# Patient Record
Sex: Female | Born: 1995
Health system: Southern US, Community
[De-identification: ages and names within clinical notes are randomized; demographics above are authoritative.]

## PROBLEM LIST (undated history)

## (undated) DIAGNOSIS — K2990 Gastroduodenitis, unspecified, without bleeding: Secondary | ICD-10-CM

## (undated) DIAGNOSIS — F32A Depression, unspecified: Secondary | ICD-10-CM

## (undated) DIAGNOSIS — E042 Nontoxic multinodular goiter: Secondary | ICD-10-CM

## (undated) DIAGNOSIS — F419 Anxiety disorder, unspecified: Secondary | ICD-10-CM

## (undated) DIAGNOSIS — G43909 Migraine, unspecified, not intractable, without status migrainosus: Secondary | ICD-10-CM

## (undated) DIAGNOSIS — J45909 Unspecified asthma, uncomplicated: Secondary | ICD-10-CM

## (undated) DIAGNOSIS — E079 Disorder of thyroid, unspecified: Secondary | ICD-10-CM

## (undated) DIAGNOSIS — F329 Major depressive disorder, single episode, unspecified: Secondary | ICD-10-CM

## (undated) HISTORY — DX: Nontoxic multinodular goiter: E04.2

## (undated) HISTORY — PX: TONSILLECTOMY: SUR1361

## (undated) HISTORY — DX: Migraine, unspecified, not intractable, without status migrainosus: G43.909

## (undated) HISTORY — DX: Anxiety disorder, unspecified: F41.9

## (undated) HISTORY — DX: Gastroduodenitis, unspecified, without bleeding: K29.90

## (undated) HISTORY — DX: Unspecified asthma, uncomplicated: J45.909

## (undated) HISTORY — PX: NO PAST SURGERIES: SHX2092

---
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
## 2016-02-03 DIAGNOSIS — E039 Hypothyroidism, unspecified: Secondary | ICD-10-CM | POA: Diagnosis not present

## 2016-02-03 DIAGNOSIS — R6889 Other general symptoms and signs: Secondary | ICD-10-CM | POA: Diagnosis not present

## 2016-02-03 DIAGNOSIS — E042 Nontoxic multinodular goiter: Secondary | ICD-10-CM | POA: Diagnosis not present

## 2016-11-01 DIAGNOSIS — E069 Thyroiditis, unspecified: Secondary | ICD-10-CM | POA: Diagnosis not present

## 2016-11-01 DIAGNOSIS — E221 Hyperprolactinemia: Secondary | ICD-10-CM | POA: Diagnosis not present

## 2016-11-30 DIAGNOSIS — F321 Major depressive disorder, single episode, moderate: Secondary | ICD-10-CM | POA: Diagnosis not present

## 2016-11-30 DIAGNOSIS — R350 Frequency of micturition: Secondary | ICD-10-CM | POA: Diagnosis not present

## 2016-12-28 DIAGNOSIS — F321 Major depressive disorder, single episode, moderate: Secondary | ICD-10-CM | POA: Diagnosis not present

## 2017-01-03 MED FILL — ESCITALOPRAM 10 MG TABLET: 10 | 30 days supply | Qty: 30 | Fill #0

## 2017-01-18 DIAGNOSIS — N390 Urinary tract infection, site not specified: Secondary | ICD-10-CM | POA: Diagnosis not present

## 2017-01-18 DIAGNOSIS — R319 Hematuria, unspecified: Secondary | ICD-10-CM | POA: Diagnosis not present

## 2017-01-30 MED FILL — ESCITALOPRAM 10 MG TABLET: 10 | 30 days supply | Qty: 30 | Fill #0

## 2017-02-12 DIAGNOSIS — N3001 Acute cystitis with hematuria: Secondary | ICD-10-CM | POA: Diagnosis not present

## 2017-02-12 DIAGNOSIS — R319 Hematuria, unspecified: Secondary | ICD-10-CM | POA: Diagnosis not present

## 2017-02-12 DIAGNOSIS — R300 Dysuria: Secondary | ICD-10-CM | POA: Diagnosis not present

## 2017-03-07 MED FILL — ESCITALOPRAM 10 MG TABLET: 10 | 30 days supply | Qty: 30 | Fill #1

## 2017-03-21 DIAGNOSIS — B349 Viral infection, unspecified: Secondary | ICD-10-CM | POA: Diagnosis not present

## 2017-03-25 DIAGNOSIS — J029 Acute pharyngitis, unspecified: Secondary | ICD-10-CM | POA: Diagnosis not present

## 2017-03-25 DIAGNOSIS — J069 Acute upper respiratory infection, unspecified: Secondary | ICD-10-CM | POA: Diagnosis not present

## 2017-03-25 DIAGNOSIS — N3001 Acute cystitis with hematuria: Secondary | ICD-10-CM | POA: Diagnosis not present

## 2017-04-03 DIAGNOSIS — N3091 Cystitis, unspecified with hematuria: Secondary | ICD-10-CM | POA: Diagnosis not present

## 2017-04-03 DIAGNOSIS — N3001 Acute cystitis with hematuria: Secondary | ICD-10-CM | POA: Diagnosis not present

## 2017-04-11 MED FILL — ESCITALOPRAM 10 MG TABLET: 10 | 30 days supply | Qty: 30 | Fill #2

## 2017-05-13 MED FILL — ESCITALOPRAM 10 MG TABLET: 10 | 30 days supply | Qty: 30 | Fill #0

## 2017-06-21 MED FILL — ESCITALOPRAM 10 MG TABLET: 10 | 30 days supply | Qty: 30 | Fill #1

## 2017-07-26 MED FILL — ESCITALOPRAM 10 MG TABLET: 10 | 30 days supply | Qty: 30 | Fill #2

## 2017-08-29 MED FILL — ESCITALOPRAM 10 MG TABLET: 10 | 30 days supply | Qty: 30 | Fill #0

## 2017-09-11 DIAGNOSIS — J302 Other seasonal allergic rhinitis: Secondary | ICD-10-CM | POA: Diagnosis not present

## 2017-09-27 DIAGNOSIS — Z0000 Encounter for general adult medical examination without abnormal findings: Secondary | ICD-10-CM | POA: Diagnosis not present

## 2017-09-27 DIAGNOSIS — J302 Other seasonal allergic rhinitis: Secondary | ICD-10-CM | POA: Diagnosis not present

## 2017-09-27 DIAGNOSIS — E042 Nontoxic multinodular goiter: Secondary | ICD-10-CM | POA: Diagnosis not present

## 2017-09-27 DIAGNOSIS — E221 Hyperprolactinemia: Secondary | ICD-10-CM | POA: Diagnosis not present

## 2017-09-27 DIAGNOSIS — F321 Major depressive disorder, single episode, moderate: Secondary | ICD-10-CM | POA: Diagnosis not present

## 2017-09-27 MED FILL — ESCITALOPRAM 10 MG TABLET: 10 | 90 days supply | Qty: 90 | Fill #0

## 2017-11-01 DIAGNOSIS — E221 Hyperprolactinemia: Secondary | ICD-10-CM | POA: Diagnosis not present

## 2017-11-01 DIAGNOSIS — E069 Thyroiditis, unspecified: Secondary | ICD-10-CM | POA: Diagnosis not present

## 2017-11-01 DIAGNOSIS — E042 Nontoxic multinodular goiter: Secondary | ICD-10-CM | POA: Diagnosis not present

## 2017-11-05 DIAGNOSIS — E069 Thyroiditis, unspecified: Secondary | ICD-10-CM | POA: Diagnosis not present

## 2017-11-05 DIAGNOSIS — E221 Hyperprolactinemia: Secondary | ICD-10-CM | POA: Diagnosis not present

## 2017-12-04 DIAGNOSIS — Z01419 Encounter for gynecological examination (general) (routine) without abnormal findings: Secondary | ICD-10-CM | POA: Diagnosis not present

## 2017-12-30 DIAGNOSIS — R399 Unspecified symptoms and signs involving the genitourinary system: Secondary | ICD-10-CM | POA: Diagnosis not present

## 2017-12-30 DIAGNOSIS — M545 Low back pain: Secondary | ICD-10-CM | POA: Diagnosis not present

## 2017-12-30 DIAGNOSIS — R110 Nausea: Secondary | ICD-10-CM | POA: Diagnosis not present

## 2017-12-30 DIAGNOSIS — R600 Localized edema: Secondary | ICD-10-CM | POA: Diagnosis not present

## 2017-12-30 MED FILL — ESCITALOPRAM 10 MG TABLET: 10 | 90 days supply | Qty: 90 | Fill #1

## 2018-01-01 DIAGNOSIS — R110 Nausea: Secondary | ICD-10-CM | POA: Diagnosis not present

## 2018-01-01 DIAGNOSIS — R399 Unspecified symptoms and signs involving the genitourinary system: Secondary | ICD-10-CM | POA: Diagnosis not present

## 2018-03-18 DIAGNOSIS — R0789 Other chest pain: Secondary | ICD-10-CM | POA: Diagnosis not present

## 2018-03-18 DIAGNOSIS — R079 Chest pain, unspecified: Secondary | ICD-10-CM | POA: Diagnosis not present

## 2018-03-18 DIAGNOSIS — E876 Hypokalemia: Secondary | ICD-10-CM | POA: Diagnosis not present

## 2018-03-18 DIAGNOSIS — E860 Dehydration: Secondary | ICD-10-CM | POA: Diagnosis not present

## 2018-03-18 DIAGNOSIS — I4510 Unspecified right bundle-branch block: Secondary | ICD-10-CM | POA: Diagnosis not present

## 2018-03-18 DIAGNOSIS — Z880 Allergy status to penicillin: Secondary | ICD-10-CM | POA: Diagnosis not present

## 2018-03-25 DIAGNOSIS — R1011 Right upper quadrant pain: Secondary | ICD-10-CM | POA: Diagnosis not present

## 2018-03-25 DIAGNOSIS — R112 Nausea with vomiting, unspecified: Secondary | ICD-10-CM | POA: Diagnosis not present

## 2018-03-25 DIAGNOSIS — E876 Hypokalemia: Secondary | ICD-10-CM | POA: Diagnosis not present

## 2018-03-25 DIAGNOSIS — R1031 Right lower quadrant pain: Secondary | ICD-10-CM | POA: Diagnosis not present

## 2018-03-25 DIAGNOSIS — R102 Pelvic and perineal pain: Secondary | ICD-10-CM | POA: Diagnosis not present

## 2018-03-25 DIAGNOSIS — D72829 Elevated white blood cell count, unspecified: Secondary | ICD-10-CM | POA: Diagnosis not present

## 2018-03-31 DIAGNOSIS — R1013 Epigastric pain: Secondary | ICD-10-CM | POA: Diagnosis not present

## 2018-03-31 DIAGNOSIS — R112 Nausea with vomiting, unspecified: Secondary | ICD-10-CM | POA: Diagnosis not present

## 2018-03-31 DIAGNOSIS — R1011 Right upper quadrant pain: Secondary | ICD-10-CM | POA: Diagnosis not present

## 2018-04-01 MED FILL — ESCITALOPRAM 10 MG TABLET: 10 | 90 days supply | Qty: 90 | Fill #2

## 2018-04-16 DIAGNOSIS — R1013 Epigastric pain: Secondary | ICD-10-CM | POA: Diagnosis not present

## 2018-04-16 DIAGNOSIS — R197 Diarrhea, unspecified: Secondary | ICD-10-CM | POA: Diagnosis not present

## 2018-04-16 DIAGNOSIS — R112 Nausea with vomiting, unspecified: Secondary | ICD-10-CM | POA: Diagnosis not present

## 2018-04-16 DIAGNOSIS — R1011 Right upper quadrant pain: Secondary | ICD-10-CM | POA: Diagnosis not present

## 2018-04-17 DIAGNOSIS — K2990 Gastroduodenitis, unspecified, without bleeding: Secondary | ICD-10-CM | POA: Diagnosis not present

## 2018-04-17 DIAGNOSIS — R112 Nausea with vomiting, unspecified: Secondary | ICD-10-CM | POA: Diagnosis not present

## 2018-04-17 DIAGNOSIS — K3189 Other diseases of stomach and duodenum: Secondary | ICD-10-CM | POA: Diagnosis not present

## 2018-04-17 DIAGNOSIS — R1013 Epigastric pain: Secondary | ICD-10-CM | POA: Diagnosis not present

## 2018-05-28 DIAGNOSIS — K219 Gastro-esophageal reflux disease without esophagitis: Secondary | ICD-10-CM | POA: Diagnosis not present

## 2018-05-28 DIAGNOSIS — R112 Nausea with vomiting, unspecified: Secondary | ICD-10-CM | POA: Diagnosis not present

## 2018-05-28 MED FILL — OMEPRAZOLE 40 MG CPDR: 40 | 90 days supply | Qty: 90 | Fill #0

## 2018-07-03 MED FILL — ESCITALOPRAM 10 MG TABLET: 10 | 90 days supply | Qty: 90 | Fill #0

## 2018-10-16 MED FILL — ESCITALOPRAM 10 MG TABLET: 10 | 90 days supply | Qty: 90 | Fill #0

## 2018-11-03 DIAGNOSIS — E069 Thyroiditis, unspecified: Secondary | ICD-10-CM | POA: Diagnosis not present

## 2018-11-03 DIAGNOSIS — E221 Hyperprolactinemia: Secondary | ICD-10-CM | POA: Diagnosis not present

## 2018-11-05 MED FILL — OMEPRAZOLE DR 40 MG CAPSULE: 40 | 90 days supply | Qty: 90 | Fill #1

## 2018-11-25 DIAGNOSIS — E2740 Unspecified adrenocortical insufficiency: Secondary | ICD-10-CM | POA: Diagnosis not present

## 2018-12-02 ENCOUNTER — Encounter (HOSPITAL_COMMUNITY): Payer: Self-pay | Admitting: Emergency Medicine

## 2018-12-02 ENCOUNTER — Other Ambulatory Visit: Payer: Self-pay

## 2018-12-02 ENCOUNTER — Emergency Department (HOSPITAL_COMMUNITY): Payer: 59

## 2018-12-02 ENCOUNTER — Emergency Department (HOSPITAL_COMMUNITY)
Admission: EM | Admit: 2018-12-02 | Discharge: 2018-12-02 | Disposition: A | Discharge status: Home / Self Care | Payer: 59 | Attending: Emergency Medicine | Admitting: Emergency Medicine

## 2018-12-02 DIAGNOSIS — R51 Headache: Secondary | ICD-10-CM | POA: Diagnosis not present

## 2018-12-02 DIAGNOSIS — R519 Headache, unspecified: Secondary | ICD-10-CM

## 2018-12-02 HISTORY — DX: Depression, unspecified: F32.A

## 2018-12-02 HISTORY — DX: Disorder of thyroid, unspecified: E07.9

## 2018-12-02 LAB — BASIC METABOLIC PANEL
Anion gap: 9 (ref 5–15)
BUN: 13 mg/dL (ref 6–20)
CO2: 26 mmol/L (ref 22–32)
Calcium: 9.2 mg/dL (ref 8.9–10.3)
Chloride: 103 mmol/L (ref 98–111)
Creatinine, Ser: 0.75 mg/dL (ref 0.44–1.00)
GFR calc Af Amer: >60 mL/min (ref >60)
GFR calc non Af Amer: >60 mL/min (ref >60)
Glucose, Bld: 96 mg/dL (ref 70–99)
Potassium: 3.5 mmol/L (ref 3.5–5.1)
Sodium: 138 mmol/L (ref 135–145)

## 2018-12-02 LAB — CBC WITH DIFFERENTIAL/PLATELET
Abs Immature Granulocytes: 0.06 10*3/uL (ref 0.00–0.07)
Basophils Absolute: 0.1 10*3/uL (ref 0.0–0.1)
Basophils Relative: 0 %
Eosinophils Absolute: 0.2 10*3/uL (ref 0.0–0.5)
Eosinophils Relative: 2 %
HCT: 37.9 % (ref 36.0–46.0)
Hemoglobin: 12.6 g/dL (ref 12.0–15.0)
Immature Granulocytes: 1 %
Lymphocytes Relative: 25 %
Lymphs Abs: 2.9 10*3/uL (ref 0.7–4.0)
MCH: 29.9 pg (ref 26.0–34.0)
MCHC: 33.2 g/dL (ref 30.0–36.0)
MCV: 89.8 fL (ref 80.0–100.0)
Monocytes Absolute: 0.8 10*3/uL (ref 0.1–1.0)
Monocytes Relative: 7 %
Neutro Abs: 7.4 10*3/uL (ref 1.7–7.7)
Neutrophils Relative %: 65 %
Platelets: 409 10*3/uL — ABNORMAL HIGH (ref 150–400)
RBC: 4.22 MIL/uL (ref 3.87–5.11)
RDW: 12.1 % (ref 11.5–15.5)
WBC: 11.5 10*3/uL — ABNORMAL HIGH (ref 4.0–10.5)
nRBC: 0 % (ref 0.0–0.2)

## 2018-12-02 LAB — I-STAT BETA HCG BLOOD, ED (MC, WL, AP ONLY): I-stat hCG, quantitative: <5 m[IU]/mL (ref <5)

## 2018-12-02 MED ORDER — IOHEXOL 350 MG/ML SOLN
75.0000 mL | Freq: Once | INTRAVENOUS | Status: AC | PRN
  Administered 2018-12-02: 75 mL via INTRAVENOUS

## 2018-12-02 MED ORDER — KETOROLAC TROMETHAMINE 15 MG/ML IJ SOLN
15.0000 mg | Freq: Once | INTRAMUSCULAR | Status: AC
  Administered 2018-12-02: 15 mg via INTRAVENOUS
  Filled 2018-12-02: qty 1

## 2018-12-02 MED ORDER — METOCLOPRAMIDE HCL 5 MG/ML IJ SOLN
10.0000 mg | Freq: Once | INTRAMUSCULAR | Status: AC
  Administered 2018-12-02: 10 mg via INTRAVENOUS
  Filled 2018-12-02: qty 2

## 2018-12-02 MED ORDER — SODIUM CHLORIDE 0.9 % IV BOLUS
500.0000 mL | Freq: Once | INTRAVENOUS | Status: AC
  Administered 2018-12-02: 500 mL via INTRAVENOUS

## 2018-12-02 MED ORDER — DIPHENHYDRAMINE HCL 50 MG/ML IJ SOLN
12.5000 mg | Freq: Once | INTRAMUSCULAR | Status: AC
  Administered 2018-12-02: 12.5 mg via INTRAVENOUS
  Filled 2018-12-02: qty 1

## 2018-12-02 NOTE — Discharge Instructions (Addendum)
You have been diagnosed today with Headache.  At this time there does not appear to be the presence of an emergent medical condition, however there is always the potential for conditions to change. Please read and follow the below instructions.  Please return to the Emergency Department immediately for any new or worsening symptoms return. Please be sure to follow up with your Primary Care Provider within one week regarding your visit today; please call their office to schedule an appointment even if you are feeling better for a follow-up visit. You may follow-up with the specialist at [facility] on her discharge paperwork, call their office tomorrow to schedule a follow-up appointment.  Get help right away if: Your headache gets very bad quickly. Your headache gets worse after a lot of physical activity. You keep throwing up. You have a stiff neck. You have trouble seeing. You have trouble speaking. You have pain in the eye or ear. Your muscles are weak or you lose muscle control. You lose your balance or have trouble walking. You feel like you will pass out (faint) or you pass out. You are mixed up (confused). You have a seizure. Any new/concerning or worsening symptoms  Please read the additional information packets attached to your discharge summary.

## 2018-12-02 NOTE — ED Triage Notes (Signed)
Pt states at about 10 am today while at work she got a sudden 10/10 headache on the left side of her head and into the left base of her skull. Pt state she has had a headache similar to this once and after a MRI they told her she may have a tumor on her pituitary gland or it could be a misshapen gland.

## 2018-12-02 NOTE — ED Notes (Signed)
Pt verbalized understanding of d/c instructions and has no further questions, VSS, NAD.  

## 2018-12-02 NOTE — ED Provider Notes (Signed)
[facility] Provider Note   CSN: [id number] Arrival date & time: 12/02/18  1027     History   Chief Complaint Chief Complaint  Patient presents with  . Headache    HPI Natasha Giles is a 23 y.o. female presents today for headache.  Patient is an [employer] and was working on shift just after 10 AM today when she had a sudden onset sharp in nature severe/worst of life headache on the right side of her head extending from the forehead all the way to the occiput that lasted approximately 45 minutes. She denies exertion prior to onset or thunderclap worsening, she describes quick onset however not instantaneous, no syncope. Patient reports that she has had headaches in the past but never this severe and they are normally in the center of her forehead and duller.  Patient reports during a headache prior she had an MRI with some sort of abnormal pituitary finding but she cannot recall exactly what this was, unable to find previous imaging in medical record.  Patient reports that her headache has begun to improve more since arrival in ED.  Patient denies fever/chills, vision changes, confusion, difficulty speaking, neck pain/stiffness, chest pain/shortness of breath, abdominal pain, nausea/vomiting, fall/injury or any additional concerns.    HPI  Past Medical History:  Diagnosis Date  . Depression   . Thyroid disease     There are no active problems to display for this patient.      OB History   No obstetric history on file.      Home Medications    Prior to Admission medications   Not on File    Family History No family history on file.  Social History Social History   Tobacco Use  . Smoking status: Never Smoker  Substance Use Topics  . Alcohol use: Yes  . Drug use: Never     Allergies   Penicillins   Review of Systems Review of Systems Ten systems are reviewed and are negative for acute change except as noted in the HPI   Physical Exam Updated Vital Signs BP (!) 109/58   Pulse 81   Temp 98.3 F (36.8 C) (Oral)   Resp 18   LMP 11/14/2018   SpO2 98%   Physical Exam Constitutional:      General: She is not in acute distress.    Appearance: Normal appearance. She is well-developed. She is not ill-appearing or diaphoretic.  HENT:     Head: Normocephalic and atraumatic. No raccoon eyes, Battle's sign or contusion.     Jaw: There is normal jaw occlusion. No trismus.     Comments: No temporal artery area tenderness    Right Ear: External ear normal.     Left Ear: External ear normal.     Ears:     Comments: Hearing grossly intact bilaterally    Nose: Nose normal. No rhinorrhea.     Right Nostril: No epistaxis.     Left Nostril: No epistaxis.     Mouth/Throat:     Mouth: Mucous membranes are moist.     Pharynx: Oropharynx is clear. Uvula midline.  Eyes:     General: Vision grossly intact. Gaze aligned appropriately.     Extraocular Movements: Extraocular movements intact.     Conjunctiva/sclera: Conjunctivae normal.     Pupils: Pupils are equal, round, and reactive to light.     Comments: Visual fields grossly intact bilaterally No pain with EOM  Neck:  Musculoskeletal: Full passive range of motion without pain, normal range of motion and neck supple. No neck rigidity.     Trachea: Trachea and phonation normal. No tracheal tenderness or tracheal deviation.     Meningeal: Brudzinski's sign absent.     Comments: No carotid artery area tenderness Cardiovascular:     Rate and Rhythm: Normal rate and regular rhythm.     Pulses:          Dorsalis pedis pulses are 2+ on the right side and 2+ on the left side.  Pulmonary:     Effort: Pulmonary effort is normal. No accessory muscle usage or respiratory distress.     Breath sounds: Normal air entry.  Abdominal:     Palpations: Abdomen is soft.     Tenderness: There is no abdominal tenderness. There is no guarding or rebound.  Musculoskeletal:      Comments: No midline C/T/L spinal tenderness to palpation, no paraspinal muscle tenderness, no deformity, crepitus, or step-off noted.  Feet:     Right foot:     Protective Sensation: 3 sites tested. 3 sites sensed.     Left foot:     Protective Sensation: 3 sites tested. 3 sites sensed.  Skin:    General: Skin is warm and dry.  Neurological:     Mental Status: She is alert and oriented to person, place, and time.     GCS: GCS eye subscore is 4. GCS verbal subscore is 5. GCS motor subscore is 6.     Comments: Mental Status: Alert, oriented, thought content appropriate, able to give a coherent history. Speech fluent without evidence of aphasia. Able to follow 2 step commands without difficulty. Cranial Nerves: II: Peripheral visual fields grossly normal, pupils equal, round, reactive to light III,IV, VI: ptosis not present, extra-ocular motions intact bilaterally V,VII: smile symmetric, eyebrows raise symmetric, facial light touch sensation equal VIII: hearing grossly normal to voice X: uvula elevates symmetrically XI: bilateral shoulder shrug symmetric and strong XII: midline tongue extension without fassiculations Motor: Normal tone. 5/5 strength in upper and lower extremities bilaterally including strong and equal grip strength and dorsiflexion/plantar flexion Sensory: Sensation intact to light touch in all extremities.Negative Romberg.  Deep Tendon Reflexes: 2+ patella Cerebellar: normal finger-to-nose maze with bilateral upper extremities. Normal heel-to -shin balance bilaterally of the lower extremity. No pronator drift.  Gait: normal gait and balance CV: distal pulses palpable throughout  Psychiatric:        Mood and Affect: Mood normal.        Behavior: Behavior is cooperative.    ED Treatments / Results  Labs (all labs ordered are listed, but only abnormal results are displayed) Labs Reviewed  CBC WITH DIFFERENTIAL/PLATELET - Abnormal; Notable for the following  components:      Result Value   WBC 11.5 (*)    Platelets 409 (*)    All other components within normal limits  BASIC METABOLIC PANEL  I-STAT BETA HCG BLOOD, ED ([facility], [facility], [facility] ONLY)    EKG None  Radiology Ct Angio Head W Or Wo Contrast  Result Date: 12/02/2018 CLINICAL DATA:  Severe headache. Rule out subarachnoid hemorrhage or aneurysm EXAM: CT ANGIOGRAPHY HEAD TECHNIQUE: Multidetector CT imaging of the head was performed using the standard protocol during bolus administration of intravenous contrast. Multiplanar CT image reconstructions and MIPs were obtained to evaluate the vascular anatomy. CONTRAST:  75mL OMNIPAQUE IOHEXOL 350 MG/ML SOLN COMPARISON:  CT head 12/02/2018 FINDINGS: CTA HEAD Anterior circulation: Cavernous carotid widely  patent bilaterally. Negative for atherosclerotic disease or aneurysm. Anterior and middle cerebral arteries widely patent without stenosis. Posterior circulation: Both vertebral arteries widely patent to the basilar. Small PICA bilaterally. Basilar widely patent. AICA, superior cerebellar, posterior cerebral arteries patent bilaterally. Fetal origin of the posterior cerebral artery bilaterally with hypoplastic P1 segments bilaterally. Negative for aneurysm. Venous sinuses: Normal venous enhancement. Anatomic variants: None IMPRESSION: Normal CTA head. Negative for aneurysm, stenosis or large vessel occlusion. Electronically Signed   By: Marlan Palau M.D.   On: 12/02/2018 14:23   Ct Head Wo Contrast  Result Date: 12/02/2018 CLINICAL DATA:  Worst headache of life EXAM: CT HEAD WITHOUT CONTRAST TECHNIQUE: Contiguous axial images were obtained from the base of the skull through the vertex without intravenous contrast. COMPARISON:  None. FINDINGS: Brain: No evidence of acute infarction, hemorrhage, hydrocephalus, extra-axial collection or mass lesion/mass effect. Vascular: Negative for hyperdense vessel Skull: Negative Sinuses/Orbits: Negative Other: None IMPRESSION:  Normal CT head Electronically Signed   By: Marlan Palau M.D.   On: 12/02/2018 12:49    Procedures Procedures (including critical care time)  Medications Ordered in ED Medications  metoCLOPramide (REGLAN) injection 10 mg (10 mg Intravenous Given 12/02/18 1150)  diphenhydrAMINE (BENADRYL) injection 12.5 mg (12.5 mg Intravenous Given 12/02/18 1149)  sodium chloride 0.9 % bolus 500 mL (0 mLs Intravenous Stopped 12/02/18 1303)  ketorolac (TORADOL) 15 MG/ML injection 15 mg (15 mg Intravenous Given 12/02/18 1344)  iohexol (OMNIPAQUE) 350 MG/ML injection 75 mL (75 mLs Intravenous Contrast Given 12/02/18 1358)     Initial Impression / Assessment and Plan / ED Course  I have reviewed the triage vital signs and the nursing notes.  Pertinent labs & imaging results that were available during my care of the patient were reviewed by me and considered in my medical decision making (see chart for details).    CBC with WBC of 11.5, patient without infectious-like symptoms, do not suspect infection at this time  BMP within normal limits  Beta-hCG negative  CT head:    IMPRESSION:  Normal CT head  - Patient overall well-appearing and in no acute distress.  Normal neurologic exam.  No sign of injury. Headache improving.  - Case discussed with Dr. Effie Shy. - Patient reassessed resting comfortably no acute distress.  Reports her headache is now minimal, greatly improved after medications given today.  She is requesting some additional medication, will give 15 mg Toradol.  Long discussion held with patient regarding further work-up here in the ER for evaluation of SAH.  I discussed LP versus CTA with the patient and her risks/benefits regarding each, shared decision-making was made and patient has elected to pursue CT angiogram for further evaluation at this time.  She refuses LP today. - CTA Head:  IMPRESSION:  Normal CTA head. Negative for aneurysm, stenosis or large vessel  occlusion.  --- On  re-evaluation, patient sleeping comfortably, easily arousable to voice.  The patient denies any neurologic symptoms such as visual changes, focal numbness/weakness, balance problems, confusion, or speech difficulty to suggest a life-threatening intracranial process in addition to reassuring imaging today. The patient has no clotting risk factors thus venous sinus thrombosis is unlikely. No fevers, neck pain or nuchal rigidity to suggest meningitis. Patient is afebrile, non-toxic and well appearing. Reassuring neuro exam.   PCP follow up strongly encouraged, will also give patient referral to neurology. I have reviewed return precautions including development of fever, nausea/vomiting or neurologic symptoms, vision changes, confusion, lethargy, difficulty speaking/walking, or other new/worsening/concerning symptoms.  Patient states understanding of return precautions. Patient is agreeable to discharge. - At this time there does not appear to be any evidence of an acute emergency medical condition and the patient appears stable for discharge with appropriate outpatient follow up. Diagnosis was discussed with patient who verbalizes understanding of care plan and is agreeable to discharge. I have discussed return precautions with patient who verbalizes understanding of return precautions. Patient encouraged to follow-up with their PCP and neurology. All questions answered.  Patient's case discussed with Dr. Eulis Foster who agrees with plan to discharge with outpatient follow-up.   Note: Portions of this report may have been transcribed using voice recognition software. Every effort was made to ensure accuracy; however, inadvertent computerized transcription errors may still be present. Final Clinical Impressions(s) / ED Diagnoses   Final diagnoses:  Nonintractable headache, unspecified chronicity pattern, unspecified headache type    ED Discharge Orders    None       Gari Crown 12/02/18 1518     Daleen Bo, MD 12/04/18 1742

## 2019-01-14 DIAGNOSIS — Z6828 Body mass index (BMI) 28.0-28.9, adult: Secondary | ICD-10-CM | POA: Diagnosis not present

## 2019-01-14 DIAGNOSIS — Z01419 Encounter for gynecological examination (general) (routine) without abnormal findings: Secondary | ICD-10-CM | POA: Diagnosis not present

## 2019-01-16 DIAGNOSIS — F321 Major depressive disorder, single episode, moderate: Secondary | ICD-10-CM | POA: Diagnosis not present

## 2019-01-16 DIAGNOSIS — Z0000 Encounter for general adult medical examination without abnormal findings: Secondary | ICD-10-CM | POA: Diagnosis not present

## 2019-01-16 DIAGNOSIS — E221 Hyperprolactinemia: Secondary | ICD-10-CM | POA: Diagnosis not present

## 2019-01-16 DIAGNOSIS — E042 Nontoxic multinodular goiter: Secondary | ICD-10-CM | POA: Diagnosis not present

## 2019-01-16 MED FILL — ESCITALOPRAM 10 MG TABLET: 10 | 90 days supply | Qty: 90 | Fill #0

## 2019-01-26 NOTE — Progress Notes (Signed)
[facility]    Provider:  Dr Lucia Gaskins Requesting Provider: Emergency room Primary Care Provider:  Patient, No Pcp Per  CC:  Migraine  HPI:  Natasha Giles is a 23 y.o. female here as requested by ER for Migraines.  Past medical history depression, thyroid disease, headaches.  Patient was seen in the emergency room at the end of August, she is an Char[employer] fundraiser and just after 10 AM she had a sudden onset sharp in quality headache severe worst of life on the right side of her head extending all the way to the occiput which lasted 45 minutes.  Quick onset, she had headaches in the past never that severe and different quality normally in the center of her forehead and duller.  Imaging was negative.  Headache had improved when she was seen. 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 They were not sure whether her pituitary gland was enlarged. The headaches went away for a few years. Within the last 6 months she has had worsening headaches. Most recently she was at work, and all of a sudden something :exploded" in her head just on the right side, in the past she would lose her vision with headaches and this time she had blurry vision but no inciting event and not even a slight headache prior to the headache, holding her head down made it feel better, she describes a sharp throbbing pain, it lasted that evening, nothing helped she still had the headache back home, t went away after sleeping. Saturday they were out of town and she started getting a migraine, it usually starts behind the eyes, +photo/phonophobia and pounding, sleeping helped and she had nausea, movement made it worse, she felt weak. She loses her vision, it goes "black" for 2 minutes and her speech pattern is impaired. Husband provides much information as well. Headaches are severe.  She has  8 days of headaches a month, 2-3 days of migraines which can be severe. She has not found anything that works for the migraines. Alleve or tylenol. Her cousin has migraines. She was having episodes during sleep, she has twitching in her legs, the last week she was twitching, her arms will move, one night she had her arms up above her like she was conducting, another night jekling her limbs, body tightening up and breathing changing.He sometimes has to hold her to keep her from hitting herself.   Reviewed notes, labs and imaging from outside physicians, which showed:  CT head 12/02/2018 showed No acute intracranial abnormalities including mass lesion or mass effect, hydrocephalus, extra-axial fluid collection, midline shift, hemorrhage, or acute infarction, large ischemic events (personally reviewed images)  CTA: reviewed report: Normal CTA head. Negative for aneurysm, stenosis or large vessel occlusion.  BMP nml, CBC with WBCs 11.5 and plts 409 otherwise normal, neg pregnancy  Review of Systems: Patient complains of symptoms per HPI as well as the following symptoms: headache, weakness, slurred speech, snoring, dizziness, depression, anxiety, loss of vision, fatige. Pertinent negatives and positives per HPI. All others negative.   Social History   Socioeconomic History   Marital status: Married    Spouse name: Not on file   Number of children: 0   Years of education: Not on file   Highest education level: Bachelor's degree (e.g., BA, AB, BS)  Occupational History   Not  on file  Social Needs   Financial resource strain: Not on file   Food insecurity    Worry: Not on file    Inability: Not on file   Transportation needs    Medical: Not on file    Non-medical: Not on file  Tobacco Use   Smoking status: Never Smoker   Smokeless tobacco: Never Used  Substance and Sexual Activity   Alcohol use: Yes    Comment: 1-2 per month, occasional   Drug use: Never   Sexual  activity: Never    Birth control/protection: None  Lifestyle   Physical activity    Days per week: Not on file    Minutes per session: Not on file   Stress: Not on file  Relationships   Social connections    Talks on phone: Not on file    Gets together: Not on file    Attends religious service: Not on file    Active member of club or organization: Not on file    Attends meetings of clubs or organizations: Not on file    Relationship status: Not on file   Intimate partner violence    Fear of current or ex partner: Not on file    Emotionally abused: Not on file    Physically abused: Not on file    Forced sexual activity: Not on file  Other Topics Concern   Not on file  Social History Narrative   Lives at home with husband, Molli Hazard   Right handed   Caffeine: 1 cup coffee/day    Family History  Problem Relation Age of Onset   Hypertension Father    Diabetes Maternal Grandmother    Diabetes Maternal Grandfather    Diabetes Paternal Grandmother    Renal Disease Paternal Grandmother        ESRD   Diabetes Paternal Grandfather    Migraines Cousin     Past Medical History:  Diagnosis Date   Anxiety    Asthma    Depression    Gastritis and duodenitis    Migraine    Multiple thyroid nodules    Thyroid disease     Patient Active Problem List   Diagnosis Date Noted   Migraine without aura and without status migrainosus, not intractable 01/27/2019    Past Surgical History:  Procedure Laterality Date   NO PAST SURGERIES      Current Outpatient Medications  Medication Sig Dispense Refill   escitalopram (LEXAPRO) 10 MG tablet Take 1 tablet by mouth daily.     ondansetron (ZOFRAN-ODT) 4 MG disintegrating tablet Take 1 tablet (4 mg total) by mouth every 8 (eight) hours as needed for nausea. 30 tablet 3   rizatriptan (MAXALT-MLT) 10 MG disintegrating tablet Take 1 tablet (10 mg total) by mouth as needed for migraine. May repeat in 2 hours if needed  9 tablet 11   No current facility-administered medications for this visit.     Allergies as of 01/27/2019 - Review Complete 01/27/2019  Allergen Reaction Noted   Penicillins  12/02/2018    Vitals: BP 125/76 (BP Location: Right Arm, Patient Position: Sitting)    Pulse 78    Temp 97.9 F (36.6 C) Comment: taken at front door   Ht 5\' 6"  (1.676 m)    Wt 167 lb (75.8 kg)    BMI 26.95 kg/m  Last Weight:  Wt Readings from Last 1 Encounters:  01/27/19 167 lb (75.8 kg)   Last Height:   Ht Readings from Last  1 Encounters:  01/27/19 5\' 6"  (1.676 m)     Physical exam: Exam: Gen: NAD, conversant, well nourised, obese, well groomed                     CV: RRR, no MRG. No Carotid Bruits. No peripheral edema, warm, nontender Eyes: Conjunctivae clear without exudates or hemorrhage  Neuro: Detailed Neurologic Exam  Speech:    Speech is normal; fluent and spontaneous with normal comprehension.  Cognition:    The patient is oriented to person, place, and time;     recent and remote memory intact;     language fluent;     normal attention, concentration,     fund of knowledge Cranial Nerves:    The pupils are equal, round, and reactive to light. The fundi are normal and spontaneous venous pulsations are present. Visual fields are full to finger confrontation. Extraocular movements are intact. Trigeminal sensation is intact and the muscles of mastication are normal. The face is symmetric. The palate elevates in the midline. Hearing intact. Voice is normal. Shoulder shrug is normal. The tongue has normal motion without fasciculations.   Coordination:    Normal finger to nose and heel to shin. Normal rapid alternating movements.   Gait:    Heel-toe and tandem gait are normal.   Motor Observation:    No asymmetry, no atrophy, and no involuntary movements noted. Tone:    Normal muscle tone.    Posture:    Posture is normal. normal erect    Strength:    Strength is V/V in the upper  and lower limbs.      Sensation: intact to LT     Reflex Exam:  DTR's:    Deep tendon reflexes in the upper and lower extremities are normal bilaterally.   Toes:    The toes are downgoing bilaterally.   Clonus:    Clonus is absent.    Assessment/Plan:  Patient with migraines but had a severe episode of thunderclap and a history of enlarged pituitary. Given vision loss and other significant symptoms she needs further evaluation. Last prolactin 17.1, TSH normal, 10/2018 CMP with BMP 9 and creat 0.7 03/25/2018  She was having episodes during sleep, she has twitching in her legs, the last week she was twitching, her arms will move, one night she had her arms up above her like she was conducting, another night jekling her limbs, body tightening up and breathing changing.He sometimes has to hold her to keep her from hitting herself. Consider sleep evaluation.   I don't think the vision loss is an aura, usually an aura is gain of function. We will monitor.   Recommended primary care   Discussed: To prevent or relieve headaches, try the following: Cool Compress. Lie down and place a cool compress on your head.  Avoid headache triggers. If certain foods or odors seem to have triggered your migraines in the past, avoid them. A headache diary might help you identify triggers.  Include physical activity in your daily routine. Try a daily walk or other moderate aerobic exercise.  Manage stress. Find healthy ways to cope with the stressors, such as delegating tasks on your to-do list.  Practice relaxation techniques. Try deep breathing, yoga, massage and visualization.  Eat regularly. Eating regularly scheduled meals and maintaining a healthy diet might help prevent headaches. Also, drink plenty of fluids.  Follow a regular sleep schedule. Sleep deprivation might contribute to headaches Consider biofeedback. With this mind-body technique,  you learn to control certain bodily functions -- such as muscle  tension, heart rate and blood pressure -- to prevent headaches or reduce headache pain.    Proceed to emergency room if you experience new or worsening symptoms or symptoms do not resolve, if you have new neurologic symptoms or if headache is severe, or for any concerning symptom.   Provided education and documentation from American headache Society toolbox including articles on: chronic migraine medication overuse headache, chronic migraines, prevention of migraines, behavioral and other nonpharmacologic treatments for headache.  Orders Placed This Encounter  Procedures   MR BRAIN W WO CONTRAST   Meds ordered this encounter  Medications   ondansetron (ZOFRAN-ODT) 4 MG disintegrating tablet    Sig: Take 1 tablet (4 mg total) by mouth every 8 (eight) hours as needed for nausea.    Dispense:  30 tablet    Refill:  3   rizatriptan (MAXALT-MLT) 10 MG disintegrating tablet    Sig: Take 1 tablet (10 mg total) by mouth as needed for migraine. May repeat in 2 hours if needed    Dispense:  9 tablet    Refill:  11    Cc: Dr. Sidney AceMchugh, Maudie FlakesPeter  Oslo Huntsman, MD  [facility] [address] [place], [place] [id number]  Phone [phone] Fax [phone]

## 2019-01-27 ENCOUNTER — Ambulatory Visit: Payer: 59 | Admitting: Neurology

## 2019-01-27 ENCOUNTER — Encounter: Payer: Self-pay | Admitting: Neurology

## 2019-01-27 ENCOUNTER — Other Ambulatory Visit: Payer: Self-pay

## 2019-01-27 ENCOUNTER — Telehealth: Payer: Self-pay | Admitting: Neurology

## 2019-01-27 VITALS — BP 125/76 | HR 78 | Temp 97.9°F | Ht 66.0 in | Wt 167.0 lb

## 2019-01-27 DIAGNOSIS — G4453 Primary thunderclap headache: Secondary | ICD-10-CM

## 2019-01-27 DIAGNOSIS — H547 Unspecified visual loss: Secondary | ICD-10-CM | POA: Diagnosis not present

## 2019-01-27 DIAGNOSIS — G4484 Primary exertional headache: Secondary | ICD-10-CM | POA: Diagnosis not present

## 2019-01-27 DIAGNOSIS — G43009 Migraine without aura, not intractable, without status migrainosus: Secondary | ICD-10-CM | POA: Diagnosis not present

## 2019-01-27 DIAGNOSIS — R519 Headache, unspecified: Secondary | ICD-10-CM

## 2019-01-27 MED ORDER — ONDANSETRON 4 MG PO TBDP: 4.0000 mg | ORAL_TABLET | Freq: Three times a day (TID) | ORAL | 3 refills | Status: DC | PRN

## 2019-01-27 MED ORDER — RIZATRIPTAN BENZOATE 10 MG PO TBDP: 10.0000 mg | ORAL_TABLET | ORAL | 11 refills | Status: DC | PRN

## 2019-01-27 MED FILL — RIZATRIPTAN 10 MG ODT: 10 | 30 days supply | Qty: 9 | Fill #0

## 2019-01-27 MED FILL — ONDANSETRON ODT 4 MG TABLET: 4 | 10 days supply | Qty: 30 | Fill #0

## 2019-01-27 NOTE — Patient Instructions (Signed)
Rizatriptan(maxalt): Please take one tablet at the onset of your headache. If it does not improve the symptoms please take one additional tablet. Do not take more then 2 tablets in 24hrs. Do not take use more then 2 to 3 times in a week.  Nausea: Zofran  May take above together with NSAIDs or Tylenol MRI of the brain w/wo contrast with pituitary protocol   Ondansetron tablets What is this medicine? ONDANSETRON (on DAN se tron) is used to treat nausea and vomiting caused by chemotherapy. It is also used to prevent or treat nausea and vomiting after surgery. This medicine may be used for other purposes; ask your health care provider or pharmacist if you have questions. COMMON BRAND NAME(S): Zofran What should I tell my health care provider before I take this medicine? They need to know if you have any of these conditions:  heart disease  history of irregular heartbeat  liver disease  low levels of magnesium or potassium in the blood  an unusual or allergic reaction to ondansetron, granisetron, other medicines, foods, dyes, or preservatives  pregnant or trying to get pregnant  breast-feeding How should I use this medicine? Take this medicine by mouth with a glass of water. Follow the directions on your prescription label. Take your doses at regular intervals. Do not take your medicine more often than directed. Talk to your pediatrician regarding the use of this medicine in children. Special care may be needed. Overdosage: If you think you have taken too much of this medicine contact a poison control center or emergency room at once. NOTE: This medicine is only for you. Do not share this medicine with others. What if I miss a dose? If you miss a dose, take it as soon as you can. If it is almost time for your next dose, take only that dose. Do not take double or extra doses. What may interact with this medicine? Do not take this medicine with any of the following  medications:  apomorphine  certain medicines for fungal infections like fluconazole, itraconazole, ketoconazole, posaconazole, voriconazole  cisapride  dronedarone  pimozide  thioridazine This medicine may also interact with the following medications:  carbamazepine  certain medicines for depression, anxiety, or psychotic disturbances  fentanyl  linezolid  MAOIs like Carbex, Eldepryl, Marplan, Nardil, and Parnate  methylene blue (injected into a vein)  other medicines that prolong the QT interval (cause an abnormal heart rhythm) like dofetilide, ziprasidone  phenytoin  rifampicin  tramadol This list may not describe all possible interactions. Give your health care provider a list of all the medicines, herbs, non-prescription drugs, or dietary supplements you use. Also tell them if you smoke, drink alcohol, or use illegal drugs. Some items may interact with your medicine. What should I watch for while using this medicine? Check with your doctor or health care professional right away if you have any sign of an allergic reaction. What side effects may I notice from receiving this medicine? Side effects that you should report to your doctor or health care professional as soon as possible:  allergic reactions like skin rash, itching or hives, swelling of the face, lips or tongue  breathing problems  confusion  dizziness  fast or irregular heartbeat  feeling faint or lightheaded, falls  fever and chills  loss of balance or coordination  seizures  sweating  swelling of the hands or feet  tightness in the chest  tremors  unusually weak or tired Side effects that usually do not require  medical attention (report to your doctor or health care professional if they continue or are bothersome):  constipation or diarrhea  headache This list may not describe all possible side effects. Call your doctor for medical advice about side effects. You may report side  effects to FDA at 1-800-FDA-1088. Where should I keep my medicine? Keep out of the reach of children. Store between 2 and 30 degrees C (36 and 86 degrees F). Throw away any unused medicine after the expiration date. NOTE: This sheet is a summary. It may not cover all possible information. If you have questions about this medicine, talk to your doctor, pharmacist, or health care provider.  2020 Elsevier/Gold Standard (2018-03-18 07:16:43) Rizatriptan disintegrating tablets What is this medicine? RIZATRIPTAN (rye za TRIP tan) is used to treat migraines with or without aura. An aura is a strange feeling or visual disturbance that warns you of an attack. It is not used to prevent migraines. This medicine may be used for other purposes; ask your health care provider or pharmacist if you have questions. COMMON BRAND NAME(S): Maxalt-MLT What should I tell my health care provider before I take this medicine? They need to know if you have any of these conditions:  cigarette smoker  circulation problems in fingers and toes  diabetes  heart disease  high blood pressure  high cholesterol  history of irregular heartbeat  history of stroke  kidney disease  liver disease  stomach or intestine problems  an unusual or allergic reaction to rizatriptan, other medicines, foods, dyes, or preservatives  pregnant or trying to get pregnant  breast-feeding How should I use this medicine? Take this medicine by mouth. Follow the directions on the prescription label. Leave the tablet in the sealed blister pack until you are ready to take it. With dry hands, open the blister and gently remove the tablet. If the tablet breaks or crumbles, throw it away and take a new tablet out of the blister pack. Place the tablet in the mouth and allow it to dissolve, and then swallow. Do not cut, crush, or chew this medicine. You do not need water to take this medicine. Do not take it more often than directed. Talk  to your pediatrician regarding the use of this medicine in children. While this drug may be prescribed for children as young as 6 years for selected conditions, precautions do apply. Overdosage: If you think you have taken too much of this medicine contact a poison control center or emergency room at once. NOTE: This medicine is only for you. Do not share this medicine with others. What if I miss a dose? This does not apply. This medicine is not for regular use. What may interact with this medicine? Do not take this medicine with any of the following medicines:  certain medicines for migraine headache like almotriptan, eletriptan, frovatriptan, naratriptan, rizatriptan, sumatriptan, zolmitriptan  ergot alkaloids like dihydroergotamine, ergonovine, ergotamine, methylergonovine  MAOIs like Carbex, Eldepryl, Marplan, Nardil, and Parnate This medicine may also interact with the following medications:  certain medicines for depression, anxiety, or psychotic disorders  propranolol This list may not describe all possible interactions. Give your health care provider a list of all the medicines, herbs, non-prescription drugs, or dietary supplements you use. Also tell them if you smoke, drink alcohol, or use illegal drugs. Some items may interact with your medicine. What should I watch for while using this medicine? Visit your healthcare professional for regular checks on your progress. Tell your healthcare professional if your  symptoms do not start to get better or if they get worse. You may get drowsy or dizzy. Do not drive, use machinery, or do anything that needs mental alertness until you know how this medicine affects you. Do not stand up or sit up quickly, especially if you are an older patient. This reduces the risk of dizzy or fainting spells. Alcohol may interfere with the effect of this medicine. Your mouth may get dry. Chewing sugarless gum or sucking hard candy and drinking plenty of water  may help. Contact your healthcare professional if the problem does not go away or is severe. If you take migraine medicines for 10 or more days a month, your migraines may get worse. Keep a diary of headache days and medicine use. Contact your healthcare professional if your migraine attacks occur more frequently. What side effects may I notice from receiving this medicine? Side effects that you should report to your doctor or health care professional as soon as possible:  allergic reactions like skin rash, itching or hives, swelling of the face, lips, or tongue  chest pain or chest tightness  signs and symptoms of a dangerous change in heartbeat or heart rhythm like chest pain; dizziness; fast, irregular heartbeat; palpitations; feeling faint or lightheaded; falls; breathing problems  signs and symptoms of a stroke like changes in vision; confusion; trouble speaking or understanding; severe headaches; sudden numbness or weakness of the face, arm or leg; trouble walking; dizziness; loss of balance or coordination  signs and symptoms of serotonin syndrome like irritable; confusion; diarrhea; fast or irregular heartbeat; muscle twitching; stiff muscles; trouble walking; sweating; high fever; seizures; chills; vomiting Side effects that usually do not require medical attention (report to your doctor or health care professional if they continue or are bothersome):  diarrhea  dizziness  drowsiness  dry mouth  headache  nausea, vomiting  pain, tingling, numbness in the hands or feet  stomach pain This list may not describe all possible side effects. Call your doctor for medical advice about side effects. You may report side effects to FDA at 1-800-FDA-1088. Where should I keep my medicine? Keep out of the reach of children. Store at room temperature between 15 and 30 degrees C (59 and 86 degrees F). Protect from light and moisture. Throw away any unused medicine after the expiration  date. NOTE: This sheet is a summary. It may not cover all possible information. If you have questions about this medicine, talk to your doctor, pharmacist, or health care provider.  2020 Elsevier/Gold Standard (2017-10-08 14:58:08)

## 2019-01-27 NOTE — Telephone Encounter (Signed)
no to the covid questions MR Brain w/wo contrast Dr. Lianne Bushy [facility] Auth: [employer] Ref # [id number]. Patient is scheduled at [facility] for 01/28/19.

## 2019-01-28 ENCOUNTER — Telehealth: Payer: Self-pay | Admitting: *Deleted

## 2019-01-28 ENCOUNTER — Ambulatory Visit: Payer: 59

## 2019-01-28 ENCOUNTER — Other Ambulatory Visit: Payer: Self-pay

## 2019-01-28 DIAGNOSIS — R519 Headache, unspecified: Secondary | ICD-10-CM | POA: Diagnosis not present

## 2019-01-28 DIAGNOSIS — G4453 Primary thunderclap headache: Secondary | ICD-10-CM

## 2019-01-28 DIAGNOSIS — G4484 Primary exertional headache: Secondary | ICD-10-CM | POA: Diagnosis not present

## 2019-01-28 DIAGNOSIS — H547 Unspecified visual loss: Secondary | ICD-10-CM | POA: Diagnosis not present

## 2019-01-28 MED ORDER — GADOBENATE DIMEGLUMINE 529 MG/ML IV SOLN
10.0000 mL | Freq: Once | INTRAVENOUS | Status: AC | PRN
  Administered 2019-01-28: 09:00:00 10 mL via INTRAVENOUS

## 2019-01-28 NOTE — Telephone Encounter (Signed)
R/c cd and report. Please give to Dr Jaynee Eagles.

## 2019-01-28 NOTE — Telephone Encounter (Signed)
Noted, Dr. Ahern aware.  

## 2019-02-02 ENCOUNTER — Telehealth: Payer: Self-pay | Admitting: *Deleted

## 2019-02-02 NOTE — Telephone Encounter (Signed)
Spoke with pt and advised her MRI brain is normal. She verbalized understanding and appreciation. She denied any questions at the time of the call.

## 2019-02-02 NOTE — Telephone Encounter (Signed)
-----   Message from Melvenia Beam, MD sent at 02/02/2019  8:51 AM EDT ----- MRI of the brain is normal, thanks

## 2019-02-24 DIAGNOSIS — N920 Excessive and frequent menstruation with regular cycle: Secondary | ICD-10-CM | POA: Diagnosis not present

## 2019-02-24 MED FILL — BLISOVI 24 FE 1-20 MG-MCG(2: 1-20 | 84 days supply | Qty: 84 | Fill #0

## 2019-04-29 ENCOUNTER — Ambulatory Visit: Payer: 59 | Admitting: Neurology

## 2019-05-13 MED FILL — ESCITALOPRAM 10 MG TABLET: 10 | 90 days supply | Qty: 90 | Fill #1

## 2019-05-13 MED FILL — BLISOVI 24 FE 1-20 MG-MCG(2: 1-20 | 84 days supply | Qty: 84 | Fill #1

## 2019-06-18 ENCOUNTER — Other Ambulatory Visit (HOSPITAL_COMMUNITY): Payer: Self-pay | Admitting: Physician Assistant

## 2019-06-18 MED FILL — TRI-LO-SPRINTEC TABLET: 0.18/0.215/ | 84 days supply | Qty: 84 | Fill #0

## 2019-06-26 MED FILL — TRI-LO-SPRINTEC TABLET: 0.18/0.215/ | 84 days supply | Qty: 84 | Fill #0

## 2019-07-29 ENCOUNTER — Other Ambulatory Visit (HOSPITAL_COMMUNITY): Payer: Self-pay | Admitting: Family Medicine

## 2019-07-29 DIAGNOSIS — F321 Major depressive disorder, single episode, moderate: Secondary | ICD-10-CM | POA: Diagnosis not present

## 2019-07-29 MED FILL — ESCITALOPRAM 10 MG TABLET: 10 | 90 days supply | Qty: 135 | Fill #0

## 2019-08-03 MED FILL — RIZATRIPTAN 10 MG ODT: 10 | 30 days supply | Qty: 9 | Fill #1

## 2019-08-06 ENCOUNTER — Ambulatory Visit: Payer: 59 | Admitting: Family Medicine

## 2019-09-02 DIAGNOSIS — R590 Localized enlarged lymph nodes: Secondary | ICD-10-CM | POA: Diagnosis not present

## 2019-09-02 DIAGNOSIS — R509 Fever, unspecified: Secondary | ICD-10-CM | POA: Diagnosis not present

## 2019-09-02 DIAGNOSIS — R5383 Other fatigue: Secondary | ICD-10-CM | POA: Diagnosis not present

## 2019-09-02 DIAGNOSIS — R42 Dizziness and giddiness: Secondary | ICD-10-CM | POA: Diagnosis not present

## 2019-11-03 DIAGNOSIS — E042 Nontoxic multinodular goiter: Secondary | ICD-10-CM | POA: Diagnosis not present

## 2019-11-03 DIAGNOSIS — E221 Hyperprolactinemia: Secondary | ICD-10-CM | POA: Diagnosis not present

## 2019-11-03 DIAGNOSIS — E069 Thyroiditis, unspecified: Secondary | ICD-10-CM | POA: Diagnosis not present

## 2019-11-16 MED FILL — TRI-LO-SPRINTEC TABLET: 0.18/0.215/ | 84 days supply | Qty: 84 | Fill #1

## 2019-12-02 MED FILL — ESCITALOPRAM 10 MG TABLET: 10 | 90 days supply | Qty: 135 | Fill #1

## 2020-01-18 ENCOUNTER — Emergency Department (HOSPITAL_COMMUNITY)
Admission: EM | Admit: 2020-01-18 | Discharge: 2020-01-18 | Disposition: A | Discharge status: Home / Self Care | Payer: 59 | Attending: Emergency Medicine | Admitting: Emergency Medicine

## 2020-01-18 ENCOUNTER — Emergency Department (HOSPITAL_COMMUNITY): Payer: 59

## 2020-01-18 ENCOUNTER — Other Ambulatory Visit: Payer: Self-pay

## 2020-01-18 ENCOUNTER — Encounter (HOSPITAL_COMMUNITY): Payer: Self-pay | Admitting: Emergency Medicine

## 2020-01-18 DIAGNOSIS — R42 Dizziness and giddiness: Secondary | ICD-10-CM | POA: Diagnosis present

## 2020-01-18 DIAGNOSIS — R569 Unspecified convulsions: Secondary | ICD-10-CM | POA: Diagnosis not present

## 2020-01-18 DIAGNOSIS — G4489 Other headache syndrome: Secondary | ICD-10-CM | POA: Insufficient documentation

## 2020-01-18 DIAGNOSIS — J45909 Unspecified asthma, uncomplicated: Secondary | ICD-10-CM | POA: Insufficient documentation

## 2020-01-18 LAB — BASIC METABOLIC PANEL
Anion gap: 10 (ref 5–15)
BUN: 8 mg/dL (ref 6–20)
CO2: 24 mmol/L (ref 22–32)
Calcium: 9.1 mg/dL (ref 8.9–10.3)
Chloride: 103 mmol/L (ref 98–111)
Creatinine, Ser: 0.79 mg/dL (ref 0.44–1.00)
GFR, Estimated: >60 mL/min (ref >60)
Glucose, Bld: 98 mg/dL (ref 70–99)
Potassium: 3.6 mmol/L (ref 3.5–5.1)
Sodium: 137 mmol/L (ref 135–145)

## 2020-01-18 LAB — CBC
HCT: 37.8 % (ref 36.0–46.0)
Hemoglobin: 12.3 g/dL (ref 12.0–15.0)
MCH: 29.6 pg (ref 26.0–34.0)
MCHC: 32.5 g/dL (ref 30.0–36.0)
MCV: 90.9 fL (ref 80.0–100.0)
Platelets: 424 10*3/uL — ABNORMAL HIGH (ref 150–400)
RBC: 4.16 MIL/uL (ref 3.87–5.11)
RDW: 11.9 % (ref 11.5–15.5)
WBC: 10.7 10*3/uL — ABNORMAL HIGH (ref 4.0–10.5)
nRBC: 0 % (ref 0.0–0.2)

## 2020-01-18 LAB — CBG MONITORING, ED: Glucose-Capillary: 85 mg/dL (ref 70–99)

## 2020-01-18 LAB — TSH: TSH: 0.979 u[IU]/mL (ref 0.350–4.500)

## 2020-01-18 LAB — I-STAT BETA HCG BLOOD, ED (MC, WL, AP ONLY): I-stat hCG, quantitative: <5 m[IU]/mL (ref <5)

## 2020-01-18 LAB — T4, FREE: Free T4: 0.78 ng/dL (ref 0.61–1.12)

## 2020-01-18 NOTE — ED Notes (Signed)
Pt transported to 46 for EEG

## 2020-01-18 NOTE — ED Triage Notes (Signed)
Pt arrives to ED from work when she suddenly became shaking and feeling dizziness. Her co worker states she seems to come be out of it at times while they were talking to her. Pt states at this time she is dizzy and just feels off. Pt has no pain is alert and ox4.

## 2020-01-18 NOTE — Discharge Instructions (Addendum)
You were seen in the ER for episode of lightheadedness, interrupted speech, headache  Your work-up today is reassuring  I discussed your case with neurologist who recommended an EEG, this was normal  Your symptoms may be due to a complicated/complex migraine.  Take your Maxalt at home for your headache.  Please call neurology and schedule an appointment for reevaluation.  Return to the ER for acute severe headache, stroke symptoms, fever, neck pain or stiffness

## 2020-01-18 NOTE — Progress Notes (Signed)
EEG complete - results pending 

## 2020-01-18 NOTE — ED Notes (Signed)
Pt transported back from 46 to [facility]20.

## 2020-01-18 NOTE — Procedures (Signed)
Patient Name: Fareeda Downard  MRN: [id number]  Epilepsy Attending: Charlsie Quest  Referring Physician/Provider: Sharen Heck, PA Date: 01/18/2020  Duration: 23.36 mins  Patient history: 24yo F with sudden onset of dizziness and lightheadedness. EEG to evaluate for seizure.  Level of alertness: Awake  AEDs during EEG study: None  Technical aspects: This EEG study was done with scalp electrodes positioned according to the 10-20 International system of electrode placement. Electrical activity was acquired at a sampling rate of 500Hz  and reviewed with a high frequency filter of 70Hz  and a low frequency filter of 1Hz . EEG data were recorded continuously and digitally stored.   Description: The posterior dominant rhythm consists of 10-11 Hz activity of moderate voltage (25-35 uV) seen predominantly in posterior head regions, symmetric and reactive to eye opening and eye closing.  Physiologic photic driving was not seen during photic stimulation.  Hyperventilation was not performed.     IMPRESSION: This study is within normal limits. No seizures or epileptiform discharges were seen throughout the recording.  Justis Dupas 

## 2020-01-18 NOTE — ED Provider Notes (Signed)
[facility] EMERGENCY DEPARTMENT Provider Note   CSN: [id number] Arrival date & time: 01/18/20  1411     History Chief Complaint  Patient presents with  . Dizziness    Jakyah Bradby is a 24 y.o. female with past medical history of migraines on Maxalt, multinodular goiter, thyroiditis, hyperprolactinemia, anxiety, depression presents to the ER for evaluation of sudden onset episode of dizziness and lightheadedness that occurred around noon while she was at work.  Patient is a Engineer, civil (consulting) upstairs.  She was sitting down when the event happened.  Describes it as feeling "weak" generally and all over her body as well as lightheaded.  A coworker supposedly was talking to her and she was not responding but was awake.  States her coworker told her that she was "spaced out".  Patient does not remember this.  Also felt like her brain was going but she could not make out the words, and it took her a little bit longer to speak and respond.  Right now she feels better but a little bit lightheaded.  Had a similar episode to a milder degree 3 weeks ago while she was at [employer] standing up.  Her symptoms eventually resolved without any intervention but states that she plans on following up with her primary care doctor about it.  Reports at least weekly migraines.  States in the past she has had difficulty with speech when she has migraines.  She does not remember this but states people around her told her that her speech was "slurred".  Patient is followed by a neurologist at [facility].  She had an MRI recently that was "unremarkable".  Also had a recent appointment with her endocrinologist and her thyroid and prolactin levels were normal and she is not requiring any medicines for these.  States lately she has been having more hot flashes and feels like she is "hyperthyroid".  Denies associated chest pain, palpitations, shortness of breath, syncope.  No known arrhythmias.  No  recent illness.  No recent vomiting or diarrhea.  She feels like she is well-hydrated today.  She had a granola bar for lunch but had a big breakfast. No interventions. CBG 85 here in ER.   HPI     Past Medical History:  Diagnosis Date  . Anxiety   . Asthma   . Depression   . Gastritis and duodenitis   . Migraine   . Multiple thyroid nodules   . Thyroid disease     Patient Active Problem List   Diagnosis Date Noted  . Migraine without aura and without status migrainosus, not intractable 01/27/2019    Past Surgical History:  Procedure Laterality Date  . NO PAST SURGERIES       OB History   No obstetric history on file.     Family History  Problem Relation Age of Onset  . Hypertension Father   . Diabetes Maternal Grandmother   . Diabetes Maternal Grandfather   . Diabetes Paternal Grandmother   . Renal Disease Paternal Grandmother        ESRD  . Diabetes Paternal Grandfather   . Migraines Cousin     Social History   Tobacco Use  . Smoking status: Never Smoker  . Smokeless tobacco: Never Used  Vaping Use  . Vaping Use: Never used  Substance Use Topics  . Alcohol use: Yes    Comment: 1-2 per month, occasional  . Drug use: Never    Home Medications Prior to Admission medications  Medication Sig Start Date End Date Taking? Authorizing Provider  escitalopram (LEXAPRO) 10 MG tablet Take 1 tablet by mouth daily. 01/16/19   [provider]  ondansetron (ZOFRAN-ODT) 4 MG disintegrating tablet Take 1 tablet (4 mg total) by mouth every 8 (eight) hours as needed for nausea. 01/27/19   Anson Fret, MD  rizatriptan (MAXALT-MLT) 10 MG disintegrating tablet Take 1 tablet (10 mg total) by mouth as needed for migraine. May repeat in 2 hours if needed 01/27/19   Anson Fret, MD    Allergies    Penicillins  Review of Systems   Review of Systems  Endocrine: Positive for heat intolerance.  Neurological: Positive for dizziness, speech difficulty,  weakness (generalized) and light-headedness.  All other systems reviewed and are negative.   Physical Exam Updated Vital Signs BP (!) 137/97 (BP Location: Right Arm)   Pulse 79   Temp 98.4 F (36.9 C) (Oral)   Resp 18   SpO2 98%   Physical Exam Vitals and nursing note reviewed.  Constitutional:      General: She is not in acute distress.    Appearance: She is well-developed.     Comments: NAD.  HENT:     Head: Normocephalic and atraumatic.     Right Ear: External ear normal.     Left Ear: External ear normal.     Nose: Nose normal.  Eyes:     General: No scleral icterus.    Conjunctiva/sclera: Conjunctivae normal.  Cardiovascular:     Rate and Rhythm: Normal rate and regular rhythm.     Heart sounds: Normal heart sounds.  Pulmonary:     Effort: Pulmonary effort is normal.     Breath sounds: Normal breath sounds.  Musculoskeletal:        General: Normal range of motion.     Cervical back: Normal range of motion and neck supple.  Skin:    General: Skin is warm and dry.     Capillary Refill: Capillary refill takes less than 2 seconds.  Neurological:     Mental Status: She is alert and oriented to person, place, and time.     Comments:   Mental Status: Patient is awake, alert, oriented to person, place, year, and situation. Patient is able to give a clear and coherent history. Speech is fluent and clear without dysarthria or aphasia. No signs of neglect.  Cranial Nerves: I not tested II visual fields full bilaterally. PERRL.   III, IV, VI EOMs intact without ptosis or diplopia  V sensation to light touch intact in all 3 divisions of trigeminal nerve bilaterally  VII facial movements symmetric bilaterally VIII hearing intact to voice/conversation  IX, X no uvula deviation, symmetric rise of soft palate/uvula XI 5/5 SCM and trapezius strength bilaterally  XII tongue protrusion midline, symmetric L/R movements  Motor: Strength 5/5 in upper/lower extremities. Sensation  to light touch intact in face, upper/lower extremities. No pronator drift. No leg drop.  Cerebellar: No ataxia with FTN or HTS. Steady gait. Normal Romberg.   Psychiatric:        Behavior: Behavior normal.        Thought Content: Thought content normal.        Judgment: Judgment normal.     ED Results / Procedures / Treatments   Labs (all labs ordered are listed, but only abnormal results are displayed) Labs Reviewed  CBC - Abnormal; Notable for the following components:      Result Value   WBC 10.7 (*)  Platelets 424 (*)    All other components within normal limits  BASIC METABOLIC PANEL  TSH  T4, FREE  URINALYSIS, ROUTINE W REFLEX MICROSCOPIC  CBG MONITORING, ED  I-STAT BETA HCG BLOOD, ED ([facility], [facility], [facility] ONLY)    EKG EKG Interpretation  Date/Time:  Monday January 18 2020 14:55:35 EDT Ventricular Rate:  84 PR Interval:  144 QRS Duration: 100 QT Interval:  352 QTC Calculation: 415 R Axis:   61 Text Interpretation: Normal sinus rhythm Nonspecific ST abnormality Abnormal ECG No old tracing to compare Confirmed by Meridee ScoreButler, Michael [phone]([id number]) on 01/18/2020 3:11:44 PM   Radiology EEG  Result Date: 01/18/2020 Charlsie QuestYadav, Priyanka O, MD     01/18/2020  9:14 PM Patient Name: Selina Cooleynisha Dresden MRN: [id number] Epilepsy Attending: Charlsie QuestPriyanka O Yadav Referring Physician/Provider: Sharen Hecklaudia Stonewall Doss, PA Date: 01/18/2020 Duration: 23.36 mins Patient history: 24yo F with sudden onset of dizziness and lightheadedness. EEG to evaluate for seizure. Level of alertness: Awake AEDs during EEG study: None Technical aspects: This EEG study was done with scalp electrodes positioned according to the 10-20 International system of electrode placement. Electrical activity was acquired at a sampling rate of 500Hz  and reviewed with a high frequency filter of 70Hz  and a low frequency filter of 1Hz . EEG data were recorded continuously and digitally stored. Description: The posterior dominant rhythm consists of 10-11 Hz  activity of moderate voltage (25-35 uV) seen predominantly in posterior head regions, symmetric and reactive to eye opening and eye closing.  Physiologic photic driving was not seen during photic stimulation.  Hyperventilation was not performed.   IMPRESSION: This study is within normal limits. No seizures or epileptiform discharges were seen throughout the recording. Charlsie QuestPriyanka O Yadav    Procedures Procedures (including critical care time)  Medications Ordered in ED Medications - No data to display  ED Course  I have reviewed the triage vital signs and the nursing notes.  Pertinent labs & imaging results that were available during my care of the patient were reviewed by me and considered in my medical decision making (see chart for details).  Clinical Course as of Jan 17 2199  Mon Jan 18, 2020  1635 Spoke to neurologist DR Derry LoryKhaliqdina recommends EEG in ER, if normal discharge with neurology follow. Low likelihood for CVA/TIA. Discussed plan with patient who is in agreement. States she is getting now a "little" headache but not sure if because she is hungry/thirsty. Declined migraine medicine in ER.    [CG]    Clinical Course User Index [CG] Liberty HandyGibbons, Jedrick Hutcherson J, PA-C   MDM Rules/Calculators/A&P                          Patient's EMR, triage nursing notes reviewed to obtain more history and assist with MDM.  Seen by Dr. Gabriel EaringAhearn [facility] October 2020 for headache.  She had an MRI brain that was unremarkable.  She is on Maxalt.  Documented aura of headaches including vision disturbances, speech disturbances as well in the past.  ER visit August 2020 with acute severe thunderclap type headache.  She had CT head and CTA of head that was unremarkable.  Patient followed up with endocrinology July 2021 at that time had prolactin, TSH levels that were normal.  Patient currently not on any medicines.  ER work-up initiated in triage by RN including CBC, BMP, CBG, hCG.  I have added TSH and T4.  DDx  includes complicated migraine with aura.  Patient describes in the past having  visual and speech disturbances with her headaches.  Currently does not have a headache however.  She has a normal neurological exam and acute CVA is unlikely.  TIA considered but she is low risk for this.  Onset of symptoms was at noon.  No fever, neck rigidity, doubt meningitis/encephalitis.  Has no cardiac symptoms and no history of arrhythmias, cardiac etiology of her symptoms very unlikely.  Patient discussed with EDP Charm Barges.  Will consult neurology for further recommendations.  1900: Consulted neurology, see above. Pending EEG in ER. Delay in EEG since patient in hall. Re-evaluated patient. Reports continued mild HA, declined IV migraine cocktail at this time.   2200: EKG reviewed by epileptologist Dr. Melynda Ripple, normal.  Patient updated.  Continues to report mild headache consistent with previous, has declined migraine medicine here and states she will take her home medicines.  DDx includes complicated/complex migraine with aura.  She has had visual and speech disturbances with previous headaches.  Recommended close follow-up with neurology.  Patient is agreement with plan of care.  Final Clinical Impression(s) / ED Diagnoses Final diagnoses:  Headache syndrome    Rx / DC Orders ED Discharge Orders    None       Liberty Handy, PA-C 01/18/20 2200    Terrilee Files, MD 01/19/20 1112

## 2020-01-20 NOTE — Progress Notes (Addendum)
Chief Complaint  Patient presents with  . Follow-up    65yr f/u for migraines. Stated migraines were stable but have returned within the past 2-3 weeks. Seen in the ED on Monday.   . room 1    alone     HISTORY OF PRESENT ILLNESS: Today 01/21/20  Natasha Giles is a 24 y.o. female here today for follow up for migraines. She feels that she was ing well for a period of time. She reports migraines have increased in frequency over the past month. She has about 4-8 migraines per month. She denies any aura symptoms. Intermittent She was using rizatriptan for abortive therapy. She does not feel that it helped that much so she hasn't taken it recently. She usually uses a heated rice bag that helps. She reports a remote history of exercise induced asthma controlled with as needed Albuterol, last used at age 49 and was told "she grew out of it".  She has never used Albuterol as an adult. She denies respiratory symptoms.   She was recently evaluated by the ER following sudden onset of dizziness and generalized weakness. Coworkers reported that she was awake but not able to respond to them. Similar events have occurred in the past and some reports of slurred speech. She feels shaky and lightheaded. She reports an episode of syncope about two weeks ago at work but did not seek medical attention. MRI was normal 01/2019. EEG 01/2020 was normal. BP was elevated, highest was 160/98. 130/82 today. She has never had issues with BP in the past. She reports dizziness and hot flashes and is concerned she is hyperthyroid. TSH, T4 normal in ER. She is followed by endocrinology and PCP. She saw endocrinology in July and had a normal check up. She has not seen PCP recently. She is going through a divorce. She does not have a great relationship with her parents. She is switching jobs to work M-F. 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 She does not sleep well. She is taking melatonin QHS.     HISTORY (copied from Dr Trevor Mace note on 01/27/2019)  HPI:  Natasha Giles is a 24 y.o. female here as requested by ER for Migraines.  Past medical history depression, thyroid disease, headaches.  Patient was seen in the emergency room at the end of August, she is an Charity fundraiser and just after 10 AM she had a sudden onset sharp in quality headache severe worst of life on the right side of her head extending all the way to the occiput which lasted 45 minutes.  Quick onset, she had headaches in the past never that severe and different quality normally in the center of her forehead and duller.  Imaging was negative.  Headache had improved when she was seen. 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 They were not sure whether her pituitary gland was enlarged. The headaches went away for a few years. Within the last 6 months she has had worsening headaches. Most recently she was at work, and all of a sudden something :exploded" in her head just on the right side, in the past she would lose her vision with headaches and this time she had blurry vision but no inciting event and not even a slight headache prior to the headache, holding her  head down made it feel better, she describes a sharp throbbing pain, it lasted that evening, nothing helped she still had the headache back home, t went away after sleeping. Saturday they were out of town and she started getting a migraine, it usually starts behind the eyes, +photo/phonophobia and pounding, sleeping helped and she had nausea, movement made it worse, she felt weak. She loses her vision, it goes "black" for 2 minutes and her speech pattern is impaired. Husband provides much information as well. Headaches are severe.  She has 8 days of headaches a month, 2-3 days of migraines which can be severe. She has not found anything  that works for the migraines. Alleve or tylenol. Her cousin has migraines. She was having episodes during sleep, she has twitching in her legs, the last week she was twitching, her arms will move, one night she had her arms up above her like she was conducting, another night jekling her limbs, body tightening up and breathing changing.He sometimes has to hold her to keep her from hitting herself.   Reviewed notes, labs and imaging from outside physicians, which showed:  CT head 12/02/2018 showed No acute intracranial abnormalities including mass lesion or mass effect, hydrocephalus, extra-axial fluid collection, midline shift, hemorrhage, or acute infarction, large ischemic events (personally reviewed images)  CTA: reviewed report: Normal CTA head. Negative for aneurysm, stenosis or large vessel occlusion.  BMP nml, CBC with WBCs 11.5 and plts 409 otherwise normal, neg pregnancy    REVIEW OF SYSTEMS: Out of a complete 14 system review of symptoms, the patient complains only of the following symptoms, migraines, dizziness, syncope, anxiety, and all other reviewed systems are negative.   ALLERGIES: Allergies  Allergen Reactions  . Penicillins      HOME MEDICATIONS: Outpatient Medications Prior to Visit  Medication Sig Dispense Refill  . escitalopram (LEXAPRO) 10 MG tablet Take 1 tablet by mouth daily.    . ondansetron (ZOFRAN-ODT) 4 MG disintegrating tablet Take 1 tablet (4 mg total) by mouth every 8 (eight) hours as needed for nausea. 30 tablet 3  . rizatriptan (MAXALT-MLT) 10 MG disintegrating tablet Take 1 tablet (10 mg total) by mouth as needed for migraine. May repeat in 2 hours if needed 9 tablet 11   No facility-administered medications prior to visit.     PAST MEDICAL HISTORY: Past Medical History:  Diagnosis Date  . Anxiety   . Asthma   . Depression   . Gastritis and duodenitis   . Migraine   . Multiple thyroid nodules   . Thyroid disease      PAST SURGICAL  HISTORY: Past Surgical History:  Procedure Laterality Date  . NO PAST SURGERIES       FAMILY HISTORY: Family History  Problem Relation Age of Onset  . Hypertension Father   . Diabetes Maternal Grandmother   . Diabetes Maternal Grandfather   . Diabetes Paternal Grandmother   . Renal Disease Paternal Grandmother        ESRD  . Diabetes Paternal Grandfather   . Migraines Cousin      SOCIAL HISTORY: Social History   Socioeconomic History  . Marital status: Married    Spouse name: Not on file  . Number of children: 0  . Years of education: Not on file  . Highest education level: Bachelor's degree (e.g., BA, AB, BS)  Occupational History  . Not on file  Tobacco Use  . Smoking status: Never Smoker  . Smokeless tobacco: Never Used  Vaping Use  .  Vaping Use: Never used  Substance and Sexual Activity  . Alcohol use: Yes    Comment: 1-2 per month, occasional  . Drug use: Never  . Sexual activity: Never    Birth control/protection: None  Other Topics Concern  . Not on file  Social History Narrative   Lives at home with husband, Molli HazardMatthew   Right handed   Caffeine: 1 cup coffee/day   Social Determinants of Health   Financial Resource Strain:   . Difficulty of Paying Living Expenses: Not on file  Food Insecurity:   . Worried About Programme researcher, broadcasting/film/videounning Out of Food in the Last Year: Not on file  . Ran Out of Food in the Last Year: Not on file  Transportation Needs:   . Lack of Transportation (Medical): Not on file  . Lack of Transportation (Non-Medical): Not on file  Physical Activity:   . Days of Exercise per Week: Not on file  . Minutes of Exercise per Session: Not on file  Stress:   . Feeling of Stress : Not on file  Social Connections:   . Frequency of Communication with Friends and Family: Not on file  . Frequency of Social Gatherings with Friends and Family: Not on file  . Attends Religious Services: Not on file  . Active Member of Clubs or Organizations: Not on file  .  Attends BankerClub or Organization Meetings: Not on file  . Marital Status: Not on file  Intimate Partner Violence:   . Fear of Current or Ex-Partner: Not on file  . Emotionally Abused: Not on file  . Physically Abused: Not on file  . Sexually Abused: Not on file      PHYSICAL EXAM  Vitals:   01/21/20 0848  BP: 130/82  Pulse: 89  Weight: 193 lb 6.4 oz (87.7 kg)  Height: 5\' 6"  (1.676 m)   Body mass index is 31.22 kg/m.   Generalized: Well developed, in no acute distress   Neurological examination  Mentation: Alert oriented to time, place, history taking. Follows all commands speech and language fluent Cranial nerve II-XII: Pupils were equal round reactive to light. Extraocular movements were full, visual field were full on confrontational test. Facial sensation and strength were normal. Head turning and shoulder shrug  were normal and symmetric. Motor: The motor testing reveals 5 over 5 strength of all 4 extremities. Good symmetric motor tone is noted throughout.  Sensory: Sensory testing is intact to soft touch on all 4 extremities. No evidence of extinction is noted.  Coordination: Cerebellar testing reveals good finger-nose-finger and heel-to-shin bilaterally.  Gait and station: Gait is normal. Tandem gait is normal. Romberg is negative. No drift is seen.  Reflexes: Deep tendon reflexes are symmetric and normal bilaterally.     DIAGNOSTIC DATA (LABS, IMAGING, TESTING) - I reviewed patient records, labs, notes, testing and imaging myself where available.  Lab Results  Component Value Date   WBC 10.7 (H) 01/18/2020   HGB 12.3 01/18/2020   HCT 37.8 01/18/2020   MCV 90.9 01/18/2020   PLT 424 (H) 01/18/2020      Component Value Date/Time   NA 137 01/18/2020 1448   K 3.6 01/18/2020 1448   CL 103 01/18/2020 1448   CO2 24 01/18/2020 1448   GLUCOSE 98 01/18/2020 1448   BUN 8 01/18/2020 1448   CREATININE 0.79 01/18/2020 1448   CALCIUM 9.1 01/18/2020 1448   GFRNONAA >60  01/18/2020 1448   GFRAA >60 12/02/2018 1142   No results found for: CHOL, HDL,  LDLCALC, LDLDIRECT, TRIG, CHOLHDL No results found for: JOIN8M No results found for: VITAMINB12 Lab Results  Component Value Date   TSH 0.979 01/18/2020      ASSESSMENT AND PLAN  24 y.o. year old female  has a past medical history of Anxiety, Asthma, Depression, Gastritis and duodenitis, Migraine, Multiple thyroid nodules, and Thyroid disease. here with   Migraine without aura and without status migrainosus, not intractable   Lorretta reports worsening headaches/migraines over the past month. She was averaging 4-8 migraine days previously but reports headaches are near daily at this time. She has been seen by the ER for concerns of generalized weakness, dizziness and slurred speech. These events occur with or without a headache. Syncopal episode was not evaluated. She has not seen PCP recently. She admits that stress levels are elevated. I have encouraged her to follow up with PCP to ensure no metabolic/cardiac cause of syncopal spell. Dizziness, slurred speech, mental fog could be related to migraines. I will start low dsoe propranolol. She has a remote history of exercise induced asthma as a child with albuterol last used around age 49. We will monitor closely for worsening symptoms. Topiramate may cause worsening brain fog and depression, she is already taking escitalopram so we will avoid tricyclics. May consider CGRP if propranolol not well tolerated. We will switch rizatriptan to sumatriptan. She was encouraged to discuss stress management and elevated BP with PCP. Healthy lifestyle habits encouraged. She will follow up in 3-6 months, sooner if needed.    I spent 20 minutes of face-to-face and non-face-to-face time with patient.  This included previsit chart review, lab review, study review, order entry, electronic health record documentation, patient education.    Shawnie Dapper, MSN, FNP-C 01/21/2020, 9:03  AM  [facility] [address], [address] [place], [place] [address] [phone]  Made any corrections needed, and agree with history, physical, neuro exam,assessment and plan as stated.     Naomie Dean, MD [facility]

## 2020-01-20 NOTE — Patient Instructions (Addendum)
We will start propranolol 10mg  daily for 1 week then increase to 10mg  twice daily if well tolerated. Please monitor for any new symptoms, specifically with breathing. Call me with any concerns.   I will switch rizatriptan to sumatriptan. You may take 1 tablet at onset of migraine. May repeat 1 tablet in 2 hours if needed but avoid taking more than 2 tablet in 24 hours or 10 tablets per month.   Follow up closely with PCP. Consider anxiety/stress management. Stay well hydrated. Well balanced diet and regular exercise encouraged.   Follow up in 3-6 months   Sumatriptan tablets What is this medicine? SUMATRIPTAN (soo ma TRIP tan) is used to treat migraines with or without aura. An aura is a strange feeling or visual disturbance that warns you of an attack. It is not used to prevent migraines. This medicine may be used for other purposes; ask your health care provider or pharmacist if you have questions. COMMON BRAND NAME(S): Imitrex, Migraine Pack What should I tell my health care provider before I take this medicine? They need to know if you have any of these conditions:  cigarette smoker  circulation problems in fingers and toes  diabetes  heart disease  high blood pressure  high cholesterol  history of irregular heartbeat  history of stroke  kidney disease  liver disease  stomach or intestine problems  an unusual or allergic reaction to sumatriptan, other medicines, foods, dyes, or preservatives  pregnant or trying to get pregnant  breast-feeding How should I use this medicine? Take this medicine by mouth with a glass of water. Follow the directions on the prescription label. Do not take it more often than directed. Talk to your pediatrician regarding the use of this medicine in children. Special care may be needed. Overdosage: If you think you have taken too much of this medicine contact a poison control center or emergency room at once. NOTE: This medicine is  only for you. Do not share this medicine with others. What if I miss a dose? This does not apply. This medicine is not for regular use. What may interact with this medicine? Do not take this medicine with any of the following medicines:  certain medicines for migraine headache like almotriptan, eletriptan, frovatriptan, naratriptan, rizatriptan, sumatriptan, zolmitriptan  ergot alkaloids like dihydroergotamine, ergonovine, ergotamine, methylergonovine  MAOIs like Carbex, Eldepryl, Marplan, Nardil, and Parnate This medicine may also interact with the following medications:  certain medicines for depression, anxiety, or psychotic disorders This list may not describe all possible interactions. Give your health care provider a list of all the medicines, herbs, non-prescription drugs, or dietary supplements you use. Also tell them if you smoke, drink alcohol, or use illegal drugs. Some items may interact with your medicine. What should I watch for while using this medicine? Visit your healthcare professional for regular checks on your progress. Tell your healthcare professional if your symptoms do not start to get better or if they get worse. You may get drowsy or dizzy. Do not drive, use machinery, or do anything that needs mental alertness until you know how this medicine affects you. Do not stand up or sit up quickly, especially if you are an older patient. This reduces the risk of dizzy or fainting spells. Alcohol may interfere with the effect of this medicine. Tell your healthcare professional right away if you have any change in your eyesight. If you take migraine medicines for 10 or more days a month, your migraines may get  worse. Keep a diary of headache days and medicine use. Contact your healthcare professional if your migraine attacks occur more frequently. What side effects may I notice from receiving this medicine? Side effects that you should report to your doctor or health care  professional as soon as possible:  allergic reactions like skin rash, itching or hives, swelling of the face, lips, or tongue  changes in vision  chest pain or chest tightness  signs and symptoms of a dangerous change in heartbeat or heart rhythm like chest pain; dizziness; fast, irregular heartbeat; palpitations; feeling faint or lightheaded; falls; breathing problems  signs and symptoms of a stroke like changes in vision; confusion; trouble speaking or understanding; severe headaches; sudden numbness or weakness of the face, arm or leg; trouble walking; dizziness; loss of balance or coordination  signs and symptoms of serotonin syndrome like irritable; confusion; diarrhea; fast or irregular heartbeat; muscle twitching; stiff muscles; trouble walking; sweating; high fever; seizures; chills; vomiting Side effects that usually do not require medical attention (report to your doctor or health care professional if they continue or are bothersome):  diarrhea  dizziness  drowsiness  dry mouth  headache  nausea, vomiting  pain, tingling, numbness in the hands or feet  stomach pain This list may not describe all possible side effects. Call your doctor for medical advice about side effects. You may report side effects to FDA at 1-800-FDA-1088. Where should I keep my medicine? Keep out of the reach of children. Store at room temperature between 2 and 30 degrees C (36 and 86 degrees F). Throw away any unused medicine after the expiration date. NOTE: This sheet is a summary. It may not cover all possible information. If you have questions about this medicine, talk to your doctor, pharmacist, or health care provider.  2020 Elsevier/Gold Standard (2017-10-08 15:05:37)    Propranolol Tablets What is this medicine? PROPRANOLOL (proe PRAN oh lole) is a beta blocker. It decreases the amount of work your heart has to do and helps your heart beat regularly. It treats high blood pressure and/or  prevent chest pain (also called angina). It is also used after a heart attack to prevent a second one. This medicine may be used for other purposes; ask your health care provider or pharmacist if you have questions. COMMON BRAND NAME(S): Inderal What should I tell my health care provider before I take this medicine? They need to know if you have any of these conditions:  circulation problems or blood vessel disease  diabetes  history of heart attack or heart disease, vasospastic angina  kidney disease  liver disease  lung or breathing disease, like asthma or emphysema  pheochromocytoma  slow heart rate  thyroid disease  an unusual or allergic reaction to propranolol, other beta-blockers, medicines, foods, dyes, or preservatives  pregnant or trying to get pregnant  breast-feeding How should I use this medicine? Take this drug by mouth. Take it as directed on the prescription label at the same time every day. Keep taking it unless your health care provider tells you to stop. Talk to your health care provider about the use of this drug in children. Special care may be needed. Overdosage: If you think you have taken too much of this medicine contact a poison control center or emergency room at once. NOTE: This medicine is only for you. Do not share this medicine with others. What if I miss a dose? If you miss a dose, take it as soon as you can. If  it is almost time for your next dose, take only that dose. Do not take double or extra doses. What may interact with this medicine? Do not take this medicine with any of the following medications:  feverfew  phenothiazines like chlorpromazine, mesoridazine, prochlorperazine, thioridazine This medicine may also interact with the following medications:  aluminum hydroxide gel  antipyrine  antiviral medicines for HIV or AIDS  barbiturates like phenobarbital  certain medicines for blood pressure, heart disease, irregular heart  beat  cimetidine  ciprofloxacin  diazepam  fluconazole  haloperidol  isoniazid  medicines for cholesterol like cholestyramine or colestipol  medicines for mental depression  medicines for migraine headache like almotriptan, eletriptan, frovatriptan, naratriptan, rizatriptan, sumatriptan, zolmitriptan  NSAIDs, medicines for pain and inflammation, like ibuprofen or naproxen  phenytoin  rifampin  teniposide  theophylline  thyroid medicines  tolbutamide  warfarin  zileuton This list may not describe all possible interactions. Give your health care provider a list of all the medicines, herbs, non-prescription drugs, or dietary supplements you use. Also tell them if you smoke, drink alcohol, or use illegal drugs. Some items may interact with your medicine. What should I watch for while using this medicine? Visit your doctor or health care professional for regular check ups. Check your blood pressure and pulse rate regularly. Ask your health care professional what your blood pressure and pulse rate should be, and when you should contact them. You may get drowsy or dizzy. Do not drive, use machinery, or do anything that needs mental alertness until you know how this drug affects you. Do not stand or sit up quickly, especially if you are an older patient. This reduces the risk of dizzy or fainting spells. Alcohol can make you more drowsy and dizzy. Avoid alcoholic drinks. This medicine may increase blood sugar. Ask your healthcare provider if changes in diet or medicines are needed if you have diabetes. Do not treat yourself for coughs, colds, or pain while you are taking this medicine without asking your doctor or health care professional for advice. Some ingredients may increase your blood pressure. What side effects may I notice from receiving this medicine? Side effects that you should report to your doctor or health care professional as soon as possible:  allergic reactions  like skin rash, itching or hives, swelling of the face, lips, or tongue  breathing problems  cold hands or feet  difficulty sleeping, nightmares  dry peeling skin  hallucinations  muscle cramps or weakness   signs and symptoms of high blood sugar such as being more thirsty or hungry or having to urinate more than normal. You may also feel very tired or have blurry vision.  slow heart rate  swelling of the legs and ankles  vomiting Side effects that usually do not require medical attention (report to your doctor or health care professional if they continue or are bothersome):  change in sex drive or performance  diarrhea  dry sore eyes  hair loss  nausea  weak or tired This list may not describe all possible side effects. Call your doctor for medical advice about side effects. You may report side effects to FDA at 1-800-FDA-1088. Where should I keep my medicine? Keep out of the reach of children and pets. Store at room temperature between 20 and 25 degrees C (68 and 77 degrees F). Protect from light. Throw away any unused drug after the expiration date. NOTE: This sheet is a summary. It may not cover all possible information. If you have  questions about this medicine, talk to your doctor, pharmacist, or health care provider.  2020 Elsevier/Gold Standard (2018-10-31 19:25:51)    To prevent or relieve headaches, try the following:  Cool Compress. Lie down and place a cool compress on your head.   Avoid headache triggers. If certain foods or odors seem to have triggered your migraines in the past, avoid them. A headache diary might help you identify triggers.   Include physical activity in your daily routine. Try a daily walk or other moderate aerobic exercise.   Manage stress. Find healthy ways to cope with the stressors, such as delegating tasks on your to-do list.   Practice relaxation techniques. Try deep breathing, yoga, massage and visualization.   Eat  regularly. Eating regularly scheduled meals and maintaining a healthy diet might help prevent headaches. Also, drink plenty of fluids.   Follow a regular sleep schedule. Sleep deprivation might contribute to headaches  Consider biofeedback. With this mind-body technique, you learn to control certain bodily functions -- such as muscle tension, heart rate and blood pressure -- to prevent headaches or reduce headache pain.      Proceed to emergency room if you experience new or worsening symptoms or symptoms do not resolve, if you have new neurologic symptoms or if headache is severe, or for any concerning symptom.  Migraine Headache A migraine headache is a very strong throbbing pain on one side or both sides of your head. This type of headache can also cause other symptoms. It can last from 4 hours to 3 days. Talk with your doctor about what things may bring on (trigger) this condition. What are the causes? The exact cause of this condition is not known. This condition may be triggered or caused by:  Drinking alcohol.  Smoking.  Taking medicines, such as: ? Medicine used to treat chest pain (nitroglycerin). ? Birth control pills. ? Estrogen. ? Some blood pressure medicines.  Eating or drinking certain products.  Doing physical activity. Other things that may trigger a migraine headache include:  Having a menstrual period.  Pregnancy.  Hunger.  Stress.  Not getting enough sleep or getting too much sleep.  Weather changes.  Tiredness (fatigue). What increases the risk?  Being 47-58 years old.  Being female.  Having a family history of migraine headaches.  Being Caucasian.  Having depression or anxiety.  Being very overweight. What are the signs or symptoms?  A throbbing pain. This pain may: ? Happen in any area of the head, such as on one side or both sides. ? Make it hard to do daily activities. ? Get worse with physical activity. ? Get worse around bright  lights or loud noises.  Other symptoms may include: ? Feeling sick to your stomach (nauseous). ? Vomiting. ? Dizziness. ? Being sensitive to bright lights, loud noises, or smells.  Before you get a migraine headache, you may get warning signs (an aura). An aura may include: ? Seeing flashing lights or having blind spots. ? Seeing bright spots, halos, or zigzag lines. ? Having tunnel vision or blurred vision. ? Having numbness or a tingling feeling. ? Having trouble talking. ? Having weak muscles.  Some people have symptoms after a migraine headache (postdromal phase), such as: ? Tiredness. ? Trouble thinking (concentrating). How is this treated?  Taking medicines that: ? Relieve pain. ? Relieve the feeling of being sick to your stomach. ? Prevent migraine headaches.  Treatment may also include: ? Having acupuncture. ? Avoiding foods that bring on  migraine headaches. ? Learning ways to control your body functions (biofeedback). ? Therapy to help you know and deal with negative thoughts (cognitive behavioral therapy). Follow these instructions at home: Medicines  Take over-the-counter and prescription medicines only as told by your doctor.  Ask your doctor if the medicine prescribed to you: ? Requires you to avoid driving or using heavy machinery. ? Can cause trouble pooping (constipation). You may need to take these steps to prevent or treat trouble pooping:  Drink enough fluid to keep your pee (urine) pale yellow.  Take over-the-counter or prescription medicines.  Eat foods that are high in fiber. These include beans, whole grains, and fresh fruits and vegetables.  Limit foods that are high in fat and sugar. These include fried or sweet foods. Lifestyle  Do not drink alcohol.  Do not use any products that contain nicotine or tobacco, such as cigarettes, e-cigarettes, and chewing tobacco. If you need help quitting, ask your doctor.  Get at least 8 hours of sleep  every night.  Limit and deal with stress. General instructions      Keep a journal to find out what may bring on your migraine headaches. For example, write down: ? What you eat and drink. ? How much sleep you get. ? Any change in what you eat or drink. ? Any change in your medicines.  If you have a migraine headache: ? Avoid things that make your symptoms worse, such as bright lights. ? It may help to lie down in a dark, quiet room. ? Do not drive or use heavy machinery. ? Ask your doctor what activities are safe for you.  Keep all follow-up visits as told by your doctor. This is important. Contact a doctor if:  You get a migraine headache that is different or worse than others you have had.  You have more than 15 headache days in one month. Get help right away if:  Your migraine headache gets very bad.  Your migraine headache lasts longer than 72 hours.  You have a fever.  You have a stiff neck.  You have trouble seeing.  Your muscles feel weak or like you cannot control them.  You start to lose your balance a lot.  You start to have trouble walking.  You pass out (faint).  You have a seizure. Summary  A migraine headache is a very strong throbbing pain on one side or both sides of your head. These headaches can also cause other symptoms.  This condition may be treated with medicines and changes to your lifestyle.  Keep a journal to find out what may bring on your migraine headaches.  Contact a doctor if you get a migraine headache that is different or worse than others you have had.  Contact your doctor if you have more than 15 headache days in a month. This information is not intended to replace advice given to you by your health care provider. Make sure you discuss any questions you have with your health care provider. Document Revised: 07/18/2018 Document Reviewed: 05/08/2018 Elsevier Patient Education  2020 [employer].

## 2020-01-21 ENCOUNTER — Other Ambulatory Visit: Payer: Self-pay

## 2020-01-21 ENCOUNTER — Other Ambulatory Visit: Payer: Self-pay | Admitting: Family Medicine

## 2020-01-21 ENCOUNTER — Ambulatory Visit: Payer: 59 | Admitting: Family Medicine

## 2020-01-21 ENCOUNTER — Telehealth: Payer: Self-pay | Admitting: Family Medicine

## 2020-01-21 ENCOUNTER — Encounter: Payer: Self-pay | Admitting: Family Medicine

## 2020-01-21 VITALS — BP 130/82 | HR 89 | Ht 66.0 in | Wt 193.4 lb

## 2020-01-21 DIAGNOSIS — G43009 Migraine without aura, not intractable, without status migrainosus: Secondary | ICD-10-CM

## 2020-01-21 MED ORDER — PROPRANOLOL HCL 10 MG PO TABS: 10.0000 mg | ORAL_TABLET | Freq: Two times a day (BID) | ORAL | 3 refills | Status: DC

## 2020-01-21 MED ORDER — PROPRANOLOL HCL 10 MG PO TABS: 10.0000 mg | ORAL_TABLET | Freq: Three times a day (TID) | ORAL | 3 refills | Status: DC

## 2020-01-21 MED ORDER — SUMATRIPTAN SUCCINATE 50 MG PO TABS: 50.0000 mg | ORAL_TABLET | ORAL | 0 refills | Status: DC | PRN

## 2020-01-21 MED FILL — PROPRANOLOL HCL 10 MG TAB: 10 | 90 days supply | Qty: 180 | Fill #0

## 2020-01-21 MED FILL — SUMAtriptan SUCCINATE 50 MG: 50 | 30 days supply | Qty: 10 | Fill #0

## 2020-01-21 NOTE — Telephone Encounter (Signed)
Called the pharmacy and left a detailed message advising of the RX clarification. Prescribed propranolol 10 mg taking 1 tab BID.

## 2020-01-21 NOTE — Telephone Encounter (Signed)
[facility]; Kathie Dike) called, have 2 prescription for propranolol (INDERAL) 10 MG tablet. First one has take 1 tablet 3 times a day, Second one 1 tablet 3 times a day. Need clarity which prescription is correct.

## 2020-01-22 MED FILL — ONDANSETRON ODT 4 MG TABLET: 4 | 10 days supply | Qty: 30 | Fill #1

## 2020-01-22 MED FILL — TRI-LO-SPRINTEC TABLET: 0.18/0.215/ | 84 days supply | Qty: 84 | Fill #2

## 2020-02-23 MED FILL — ESCITALOPRAM 10 MG TABLET: 10 | 90 days supply | Qty: 135 | Fill #2

## 2020-04-06 ENCOUNTER — Other Ambulatory Visit (HOSPITAL_COMMUNITY): Payer: Self-pay | Admitting: Family Medicine

## 2020-04-06 DIAGNOSIS — J358 Other chronic diseases of tonsils and adenoids: Secondary | ICD-10-CM | POA: Diagnosis not present

## 2020-04-06 DIAGNOSIS — J039 Acute tonsillitis, unspecified: Secondary | ICD-10-CM | POA: Diagnosis not present

## 2020-04-06 MED FILL — CEFDINIR 300 MG CAPSULE: 300 | 10 days supply | Qty: 20 | Fill #0

## 2020-04-06 MED FILL — FLUCONAZOLE 150 MG TABS: 150 | 3 days supply | Qty: 2 | Fill #0

## 2020-04-26 DIAGNOSIS — J3501 Chronic tonsillitis: Secondary | ICD-10-CM | POA: Diagnosis not present

## 2020-04-26 NOTE — Patient Instructions (Incomplete)
Below is our plan:  We will ***  Please make sure you are staying well hydrated. I recommend 50-60 ounces daily. Well balanced diet and regular exercise encouraged.    Please continue follow up with care team as directed.   Follow up with *** in ***  You may receive a survey regarding today's visit. I encourage you to leave honest feed back as I do use this information to improve patient care. Thank you for seeing me today!      Migraine Headache A migraine headache is a very strong throbbing pain on one side or both sides of your head. This type of headache can also cause other symptoms. It can last from 4 hours to 3 days. Talk with your doctor about what things may bring on (trigger) this condition. What are the causes? The exact cause of this condition is not known. This condition may be triggered or caused by:  Drinking alcohol.  Smoking.  Taking medicines, such as: ? Medicine used to treat chest pain (nitroglycerin). ? Birth control pills. ? Estrogen. ? Some blood pressure medicines.  Eating or drinking certain products.  Doing physical activity. Other things that may trigger a migraine headache include:  Having a menstrual period.  Pregnancy.  Hunger.  Stress.  Not getting enough sleep or getting too much sleep.  Weather changes.  Tiredness (fatigue). What increases the risk?  Being 25-55 years old.  Being female.  Having a family history of migraine headaches.  Being Caucasian.  Having depression or anxiety.  Being very overweight. What are the signs or symptoms?  A throbbing pain. This pain may: ? Happen in any area of the head, such as on one side or both sides. ? Make it hard to do daily activities. ? Get worse with physical activity. ? Get worse around bright lights or loud noises.  Other symptoms may include: ? Feeling sick to your stomach (nauseous). ? Vomiting. ? Dizziness. ? Being sensitive to bright lights, loud noises, or  smells.  Before you get a migraine headache, you may get warning signs (an aura). An aura may include: ? Seeing flashing lights or having blind spots. ? Seeing bright spots, halos, or zigzag lines. ? Having tunnel vision or blurred vision. ? Having numbness or a tingling feeling. ? Having trouble talking. ? Having weak muscles.  Some people have symptoms after a migraine headache (postdromal phase), such as: ? Tiredness. ? Trouble thinking (concentrating). How is this treated?  Taking medicines that: ? Relieve pain. ? Relieve the feeling of being sick to your stomach. ? Prevent migraine headaches.  Treatment may also include: ? Having acupuncture. ? Avoiding foods that bring on migraine headaches. ? Learning ways to control your body functions (biofeedback). ? Therapy to help you know and deal with negative thoughts (cognitive behavioral therapy). Follow these instructions at home: Medicines  Take over-the-counter and prescription medicines only as told by your doctor.  Ask your doctor if the medicine prescribed to you: ? Requires you to avoid driving or using heavy machinery. ? Can cause trouble pooping (constipation). You may need to take these steps to prevent or treat trouble pooping:  Drink enough fluid to keep your pee (urine) pale yellow.  Take over-the-counter or prescription medicines.  Eat foods that are high in fiber. These include beans, whole grains, and fresh fruits and vegetables.  Limit foods that are high in fat and sugar. These include fried or sweet foods. Lifestyle  Do not drink alcohol.  Do   not use any products that contain nicotine or tobacco, such as cigarettes, e-cigarettes, and chewing tobacco. If you need help quitting, ask your doctor.  Get at least 8 hours of sleep every night.  Limit and deal with stress. General instructions  Keep a journal to find out what may bring on your migraine headaches. For example, write down: ? What you eat  and drink. ? How much sleep you get. ? Any change in what you eat or drink. ? Any change in your medicines.  If you have a migraine headache: ? Avoid things that make your symptoms worse, such as bright lights. ? It may help to lie down in a dark, quiet room. ? Do not drive or use heavy machinery. ? Ask your doctor what activities are safe for you.  Keep all follow-up visits as told by your doctor. This is important.      Contact a doctor if:  You get a migraine headache that is different or worse than others you have had.  You have more than 15 headache days in one month. Get help right away if:  Your migraine headache gets very bad.  Your migraine headache lasts longer than 72 hours.  You have a fever.  You have a stiff neck.  You have trouble seeing.  Your muscles feel weak or like you cannot control them.  You start to lose your balance a lot.  You start to have trouble walking.  You pass out (faint).  You have a seizure. Summary  A migraine headache is a very strong throbbing pain on one side or both sides of your head. These headaches can also cause other symptoms.  This condition may be treated with medicines and changes to your lifestyle.  Keep a journal to find out what may bring on your migraine headaches.  Contact a doctor if you get a migraine headache that is different or worse than others you have had.  Contact your doctor if you have more than 15 headache days in a month. This information is not intended to replace advice given to you by your health care provider. Make sure you discuss any questions you have with your health care provider. Document Revised: 07/18/2018 Document Reviewed: 05/08/2018 Elsevier Patient Education  2021 [employer]  

## 2020-04-26 NOTE — Progress Notes (Deleted)
No chief complaint on file.   HISTORY OF PRESENT ILLNESS: Today 04/26/20  She returns for migraine follow up. We started propranolol 10mg  BID and sumatriptan PRN in 01/2020. Syncope    01/21/2020 ALL: Natasha Giles is a 25 y.o. female here today for follow up for migraines. She feels that she was ing well for a period of time. She reports migraines have increased in frequency over the past month. She has about 4-8 migraines per month. She denies any aura symptoms. Intermittent She was using rizatriptan for abortive therapy. She does not feel that it helped that much so she hasn't taken it recently. She usually uses a heated rice bag that helps. She reports a remote history of exercise induced asthma controlled with as needed Albuterol, last used at age 25 and was told "she grew out of it".  She has never used Albuterol as an adult. She denies respiratory symptoms.   She was recently evaluated by the ER following sudden onset of dizziness and generalized weakness. Coworkers reported that she was awake but not able to respond to them. Similar events have occurred in the past and some reports of slurred speech. She feels shaky and lightheaded. She reports an episode of syncope about two weeks ago at work but did not seek medical attention. MRI was normal 01/2019. EEG 01/2020 was normal. BP was elevated, highest was 160/98. 130/82 today. She has never had issues with BP in the past. She reports dizziness and hot flashes and is concerned she is hyperthyroid. TSH, T4 normal in ER. She is followed by endocrinology and PCP. She saw endocrinology in July and had a normal check up. She has not seen PCP recently. She is going through a divorce. She does not have a great relationship with her parents. She is switching jobs to work M-F. 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 She does not sleep well. She is taking melatonin QHS.    HISTORY (copied from Dr Trevor MaceAhern's note on  01/27/2019)  HPI:  Natasha Cooleynisha Sias is a 25 y.o. female here as requested by ER for Migraines.  Past medical history depression, thyroid disease, headaches.  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  Quick onset, she had headaches in the past never that severe and different quality normally in the center of her forehead and duller.  Imaging was negative.  Headache had improved when she was seen. 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 They were not sure whether her pituitary gland was enlarged. The headaches went away for a few years. Within the last 6 months she has had worsening headaches. Most recently she was at work, and all of a sudden something :exploded" in her head just on the right side, in the past she would lose her vision with headaches and this time she had blurry vision but no inciting event and not even a slight headache prior to the headache, holding her head down made it feel better, she describes a sharp throbbing pain, it lasted that evening, nothing helped  she still had the headache back home, t went away after sleeping. Saturday they were out of town and she started getting a migraine, it usually starts behind the eyes, +photo/phonophobia and pounding, sleeping helped and she had nausea, movement made it worse, she felt weak. She loses her vision, it goes "black" for 2 minutes and her speech pattern is impaired. Husband provides much information as well. Headaches are severe.  She has 8 days of headaches a month, 2-3 days of migraines which can be severe. She has not found anything that works for the migraines. Alleve or  tylenol. Her cousin has migraines. She was having episodes during sleep, she has twitching in her legs, the last week she was twitching, her arms will move, one night she had her arms up above her like she was conducting, another night jekling her limbs, body tightening up and breathing changing.He sometimes has to hold her to keep her from hitting herself.   Reviewed notes, labs and imaging from outside physicians, which showed:  CT head 12/02/2018 showed No acute intracranial abnormalities including mass lesion or mass effect, hydrocephalus, extra-axial fluid collection, midline shift, hemorrhage, or acute infarction, large ischemic events (personally reviewed images)  CTA: reviewed report: Normal CTA head. Negative for aneurysm, stenosis or large vessel occlusion.  BMP nml, CBC with WBCs 11.5 and plts 409 otherwise normal, neg pregnancy    REVIEW OF SYSTEMS: Out of a complete 14 system review of symptoms, the patient complains only of the following symptoms, migraines, dizziness, syncope, anxiety, and all other reviewed systems are negative.   ALLERGIES: Allergies  Allergen Reactions  . Penicillins      HOME MEDICATIONS: Outpatient Medications Prior to Visit  Medication Sig Dispense Refill  . escitalopram (LEXAPRO) 10 MG tablet Take 1 tablet by mouth daily.    . ondansetron (ZOFRAN-ODT) 4 MG disintegrating tablet Take 1 tablet (4 mg total) by mouth every 8 (eight) hours as needed for nausea. 30 tablet 3  . propranolol (INDERAL) 10 MG tablet Take 1 tablet (10 mg total) by mouth 2 (two) times daily. 180 tablet 3  . SUMAtriptan (IMITREX) 50 MG tablet Take 1 tablet (50 mg total) by mouth every 2 (two) hours as needed for migraine. May repeat in 2 hours if headache persists or recurs. 10 tablet 0   No facility-administered medications prior to visit.     PAST MEDICAL HISTORY: Past Medical History:  Diagnosis Date  . Anxiety   . Asthma   . Depression   . Gastritis and  duodenitis   . Migraine   . Multiple thyroid nodules   . Thyroid disease      PAST SURGICAL HISTORY: Past Surgical History:  Procedure Laterality Date  . NO PAST SURGERIES       FAMILY HISTORY: Family History  Problem Relation Age of Onset  . Hypertension Father   . Diabetes Maternal Grandmother   . Diabetes Maternal Grandfather   . Diabetes Paternal Grandmother   . Renal Disease Paternal Grandmother        ESRD  . Diabetes Paternal Grandfather   . Migraines Cousin      SOCIAL HISTORY: Social History   Socioeconomic History  . Marital status: Married    Spouse name: Not on file  . Number of children: 0  . Years of education: Not on file  . Highest education level: Bachelor's degree (e.g., BA, AB, BS)  Occupational History  . Not on file  Tobacco Use  . Smoking status: Never  Smoker  . Smokeless tobacco: Never Used  Vaping Use  . Vaping Use: Never used  Substance and Sexual Activity  . Alcohol use: Yes    Comment: 1-2 per month, occasional  . Drug use: Never  . Sexual activity: Never    Birth control/protection: None  Other Topics Concern  . Not on file  Social History Narrative   Lives at home with husband, Molli Hazard   Right handed   Caffeine: 1 cup coffee/day   Social Determinants of Health   Financial Resource Strain: Not on file  Food Insecurity: Not on file  Transportation Needs: Not on file  Physical Activity: Not on file  Stress: Not on file  Social Connections: Not on file  Intimate Partner Violence: Not on file      PHYSICAL EXAM  There were no vitals filed for this visit. There is no height or weight on file to calculate BMI.   Generalized: Well developed, in no acute distress   Neurological examination  Mentation: Alert oriented to time, place, history taking. Follows all commands speech and language fluent Cranial nerve II-XII: Pupils were equal round reactive to light. Extraocular movements were full, visual field were full on  confrontational test. Facial sensation and strength were normal. Head turning and shoulder shrug  were normal and symmetric. Motor: The motor testing reveals 5 over 5 strength of all 4 extremities. Good symmetric motor tone is noted throughout.  Sensory: Sensory testing is intact to soft touch on all 4 extremities. No evidence of extinction is noted.  Coordination: Cerebellar testing reveals good finger-nose-finger and heel-to-shin bilaterally.  Gait and station: Gait is normal. Tandem gait is normal. Romberg is negative. No drift is seen.  Reflexes: Deep tendon reflexes are symmetric and normal bilaterally.     DIAGNOSTIC DATA (LABS, IMAGING, TESTING) - I reviewed patient records, labs, notes, testing and imaging myself where available.  Lab Results  Component Value Date   WBC 10.7 (H) 01/18/2020   HGB 12.3 01/18/2020   HCT 37.8 01/18/2020   MCV 90.9 01/18/2020   PLT 424 (H) 01/18/2020      Component Value Date/Time   NA 137 01/18/2020 1448   K 3.6 01/18/2020 1448   CL 103 01/18/2020 1448   CO2 24 01/18/2020 1448   GLUCOSE 98 01/18/2020 1448   BUN 8 01/18/2020 1448   CREATININE 0.79 01/18/2020 1448   CALCIUM 9.1 01/18/2020 1448   GFRNONAA >60 01/18/2020 1448   GFRAA >60 12/02/2018 1142   No results found for: CHOL, HDL, LDLCALC, LDLDIRECT, TRIG, CHOLHDL No results found for: TFTD3U No results found for: VITAMINB12 Lab Results  Component Value Date   TSH 0.979 01/18/2020      ASSESSMENT AND PLAN  25 y.o. year old female  has a past medical history of Anxiety, Asthma, Depression, Gastritis and duodenitis, Migraine, Multiple thyroid nodules, and Thyroid disease. here with   Migraine without aura and without status migrainosus, not intractable   Satoya reports worsening headaches/migraines over the past month. She was averaging 4-8 migraine days previously but reports headaches are near daily at this time. She has been seen by the ER for concerns of generalized  weakness, dizziness and slurred speech. These events occur with or without a headache. Syncopal episode was not evaluated. She has not seen PCP recently. She admits that stress levels are elevated. I have encouraged her to follow up with PCP to ensure no metabolic/cardiac cause of syncopal spell. Dizziness, slurred speech, mental fog could be related  to migraines. I will start low dsoe propranolol. She has a remote history of exercise induced asthma as a child with albuterol last used around age 65. We will monitor closely for worsening symptoms. Topiramate may cause worsening brain fog and depression, she is already taking escitalopram so we will avoid tricyclics. May consider CGRP if propranolol not well tolerated. We will switch rizatriptan to sumatriptan. She was encouraged to discuss stress management and elevated BP with PCP. Healthy lifestyle habits encouraged. She will follow up in 3-6 months, sooner if needed.    I spent 20 minutes of face-to-face and non-face-to-face time with patient.  This included previsit chart review, lab review, study review, order entry, electronic health record documentation, patient education.    Shawnie Dapper, MSN, FNP-C 04/26/2020, 2:14 PM  [facility] [address] [place], [place] [address] [phone]

## 2020-04-27 ENCOUNTER — Ambulatory Visit: Payer: 59 | Admitting: Family Medicine

## 2020-05-03 MED FILL — TRI-LO-SPRINTEC TABLET: 0.18/0.215/ | 84 days supply | Qty: 84 | Fill #3

## 2020-05-26 NOTE — Patient Instructions (Addendum)
Below is our plan:  We will increase propranolol to 10mg  three times daily. Monitor for any side effects. Continue sumatriptan for abortive therapy. We will also try Nurtec in the event sumatriptan is not helping. If we can't get Nurtec covered we will try Ubrelvy. Keep your eye on your BP. I recommend checking it 2-3 times a week.   Please make sure you are staying well hydrated. I recommend 50-60 ounces daily. Well balanced diet and regular exercise encouraged. Consistent sleep schedule with 6-8 hours recommended.   Please continue follow up with care team as directed.   Follow up with me in 6 months.   You may receive a survey regarding today's visit. I encourage you to leave honest feed back as I do use this information to improve patient care. Thank you for seeing me today!    Rimegepant oral dissolving tablet What is this medicine? RIMEGEPANT (ri ME je pant) is used to treat migraine headaches with or without aura. An aura is a strange feeling or visual disturbance that warns you of an attack. It is also used to prevent migraine headaches. This medicine may be used for other purposes; ask your health care provider or pharmacist if you have questions. COMMON BRAND NAME(S): NURTEC ODT What should I tell my health care provider before I take this medicine? They need to know if you have any of these conditions:  kidney disease  liver disease  an unusual or allergic reaction to rimegepant, other medicines, foods, dyes, or preservatives  pregnant or trying to get pregnant  breast-feeding How should I use this medicine? Take the medicine by mouth. Follow the directions on the prescription label. Leave the tablet in the sealed blister pack until you are ready to take it. With dry hands, open the blister and gently remove the tablet. If the tablet breaks or crumbles, throw it away and take a new tablet out of the blister pack. Place the tablet in the mouth and allow it to dissolve, and  then swallow. Do not cut, crush, or chew this medicine. You do not need water to take this medicine. Talk to your pediatrician about the use of this medicine in children. Special care may be needed. Overdosage: If you think you have taken too much of this medicine contact a poison control center or emergency room at once. NOTE: This medicine is only for you. Do not share this medicine with others. What if I miss a dose? This does not apply. This medicine is not for regular use. What may interact with this medicine? This medicine may interact with the following medications:  certain medicines for fungal infections like fluconazole, itraconazole  rifampin This list may not describe all possible interactions. Give your health care provider a list of all the medicines, herbs, non-prescription drugs, or dietary supplements you use. Also tell them if you smoke, drink alcohol, or use illegal drugs. Some items may interact with your medicine. What should I watch for while using this medicine? Visit your health care professional for regular checks on your progress. Tell your health care professional if your symptoms do not start to get better or if they get worse. What side effects may I notice from receiving this medicine? Side effects that you should report to your doctor or health care professional as soon as possible:  allergic reactions like skin rash, itching or hives; swelling of the face, lips, or tongue Side effects that usually do not require medical attention (report these to your  doctor or health care professional if they continue or are bothersome):  nausea This list may not describe all possible side effects. Call your doctor for medical advice about side effects. You may report side effects to FDA at 1-800-FDA-1088. Where should I keep my medicine? Keep out of the reach of children and pets. Store at room temperature between 20 and 25 degrees C (68 and 77 degrees F). Get rid of any  unused medicine after the expiration date. To get rid of medicines that are no longer needed or have expired:  Take the medicine to a medicine take-back program. Check with your pharmacy or law enforcement to find a location.  If you cannot return the medicine, check the label or package insert to see if the medicine should be thrown out in the garbage or flushed down the toilet. If you are not sure, ask your health care provider. If it is safe to put it in the trash, take the medicine out of the container. Mix the medicine with cat litter, dirt, coffee grounds, or other unwanted substance. Seal the mixture in a bag or container. Put it in the trash. NOTE: This sheet is a summary. It may not cover all possible information. If you have questions about this medicine, talk to your doctor, pharmacist, or health care provider.  2021 [employer]vier/Gold Standard (2019-09-08 17:56:55)    Migraine Headache A migraine headache is a very strong throbbing pain on one side or both sides of your head. This type of headache can also cause other symptoms. It can last from 4 hours to 3 days. Talk with your doctor about what things may bring on (trigger) this condition. What are the causes? The exact cause of this condition is not known. This condition may be triggered or caused by:  Drinking alcohol.  Smoking.  Taking medicines, such as: ? Medicine used to treat chest pain (nitroglycerin). ? Birth control pills. ? Estrogen. ? Some blood pressure medicines.  Eating or drinking certain products.  Doing physical activity. Other things that may trigger a migraine headache include:  Having a menstrual period.  Pregnancy.  Hunger.  Stress.  Not getting enough sleep or getting too much sleep.  Weather changes.  Tiredness (fatigue). What increases the risk?  Being 58-21 years old.  Being female.  Having a family history of migraine headaches.  Being Caucasian.  Having depression or  anxiety.  Being very overweight. What are the signs or symptoms?  A throbbing pain. This pain may: ? Happen in any area of the head, such as on one side or both sides. ? Make it hard to do daily activities. ? Get worse with physical activity. ? Get worse around bright lights or loud noises.  Other symptoms may include: ? Feeling sick to your stomach (nauseous). ? Vomiting. ? Dizziness. ? Being sensitive to bright lights, loud noises, or smells.  Before you get a migraine headache, you may get warning signs (an aura). An aura may include: ? Seeing flashing lights or having blind spots. ? Seeing bright spots, halos, or zigzag lines. ? Having tunnel vision or blurred vision. ? Having numbness or a tingling feeling. ? Having trouble talking. ? Having weak muscles.  Some people have symptoms after a migraine headache (postdromal phase), such as: ? Tiredness. ? Trouble thinking (concentrating). How is this treated?  Taking medicines that: ? Relieve pain. ? Relieve the feeling of being sick to your stomach. ? Prevent migraine headaches.  Treatment may also include: ? Having  acupuncture. ? Avoiding foods that bring on migraine headaches. ? Learning ways to control your body functions (biofeedback). ? Therapy to help you know and deal with negative thoughts (cognitive behavioral therapy). Follow these instructions at home: Medicines  Take over-the-counter and prescription medicines only as told by your doctor.  Ask your doctor if the medicine prescribed to you: ? Requires you to avoid driving or using heavy machinery. ? Can cause trouble pooping (constipation). You may need to take these steps to prevent or treat trouble pooping:  Drink enough fluid to keep your pee (urine) pale yellow.  Take over-the-counter or prescription medicines.  Eat foods that are high in fiber. These include beans, whole grains, and fresh fruits and vegetables.  Limit foods that are high in fat  and sugar. These include fried or sweet foods. Lifestyle  Do not drink alcohol.  Do not use any products that contain nicotine or tobacco, such as cigarettes, e-cigarettes, and chewing tobacco. If you need help quitting, ask your doctor.  Get at least 8 hours of sleep every night.  Limit and deal with stress. General instructions  Keep a journal to find out what may bring on your migraine headaches. For example, write down: ? What you eat and drink. ? How much sleep you get. ? Any change in what you eat or drink. ? Any change in your medicines.  If you have a migraine headache: ? Avoid things that make your symptoms worse, such as bright lights. ? It may help to lie down in a dark, quiet room. ? Do not drive or use heavy machinery. ? Ask your doctor what activities are safe for you.  Keep all follow-up visits as told by your doctor. This is important.      Contact a doctor if:  You get a migraine headache that is different or worse than others you have had.  You have more than 15 headache days in one month. Get help right away if:  Your migraine headache gets very bad.  Your migraine headache lasts longer than 72 hours.  You have a fever.  You have a stiff neck.  You have trouble seeing.  Your muscles feel weak or like you cannot control them.  You start to lose your balance a lot.  You start to have trouble walking.  You pass out (faint).  You have a seizure. Summary  A migraine headache is a very strong throbbing pain on one side or both sides of your head. These headaches can also cause other symptoms.  This condition may be treated with medicines and changes to your lifestyle.  Keep a journal to find out what may bring on your migraine headaches.  Contact a doctor if you get a migraine headache that is different or worse than others you have had.  Contact your doctor if you have more than 15 headache days in a month. This information is not intended  to replace advice given to you by your health care provider. Make sure you discuss any questions you have with your health care provider. Document Revised: 07/18/2018 Document Reviewed: 05/08/2018 Elsevier Patient Education  2021 [employer]    Ubrogepant tablets What is this medicine? UBROGEPANT (ue BROE je pant) is used to treat migraine headaches with or without aura. An aura is a strange feeling or visual disturbance that warns you of an attack. It is not used to prevent migraines. This medicine may be used for other purposes; ask your health care provider or  pharmacist if you have questions. COMMON BRAND NAME(S): Bernita Raisin What should I tell my health care provider before I take this medicine? They need to know if you have any of these conditions:  kidney disease  liver disease  an unusual or allergic reaction to ubrogepant, other medicines, foods, dyes, or preservatives  pregnant or trying to get pregnant  breast-feeding How should I use this medicine? Take this medicine by mouth with a glass of water. Follow the directions on the prescription label. You can take it with or without food. If it upsets your stomach, take it with food. Take your medicine at regular intervals. Do not take it more often than directed. Do not stop taking except on your doctor's advice. Talk to your pediatrician about the use of this medicine in children. Special care may be needed. Overdosage: If you think you have taken too much of this medicine contact a poison control center or emergency room at once. NOTE: This medicine is only for you. Do not share this medicine with others. What if I miss a dose? This does not apply. This medicine is not for regular use. What may interact with this medicine? Do not take this medicine with any of the following medicines:  ceritinib  certain antibiotics like chloramphenicol, clarithromycin, telithromycin  certain antivirals for HIV like atazanavir,  cobicistat, darunavir, delavirdine, fosamprenavir, indinavir, ritonavir  certain medicines for fungal infections like itraconazole, ketoconazole, posaconazole, voriconazole  conivaptan  grapefruit  idelalisib  mifepristone  nefazodone  ribociclib This medicine may also interact with the following medications:  carvedilol  certain medicines for seizures like phenobarbital, phenytoin  ciprofloxacin  cyclosporine  eltrombopag  fluconazole  fluvoxamine  quinidine  rifampin  St. John's wort  verapamil This list may not describe all possible interactions. Give your health care provider a list of all the medicines, herbs, non-prescription drugs, or dietary supplements you use. Also tell them if you smoke, drink alcohol, or use illegal drugs. Some items may interact with your medicine. What should I watch for while using this medicine? Visit your health care professional for regular checks on your progress. Tell your health care professional if your symptoms do not start to get better or if they get worse. Your mouth may get dry. Chewing sugarless gum or sucking hard candy and drinking plenty of water may help. Contact your health care professional if the problem does not go away or is severe. What side effects may I notice from receiving this medicine? Side effects that you should report to your doctor or health care professional as soon as possible:  allergic reactions like skin rash, itching or hives; swelling of the face, lips, or tongue Side effects that usually do not require medical attention (report these to your doctor or health care professional if they continue or are bothersome):  drowsiness  dry mouth  nausea  tiredness This list may not describe all possible side effects. Call your doctor for medical advice about side effects. You may report side effects to FDA at 1-800-FDA-1088. Where should I keep my medicine? Keep out of the reach of children. Store  at room temperature between 15 and 30 degrees C (59 and 86 degrees F). Throw away any unused medicine after the expiration date. NOTE: This sheet is a summary. It may not cover all possible information. If you have questions about this medicine, talk to your doctor, pharmacist, or health care provider.  2021 [employer]/Gold Standard (2018-06-12 08:50:55)

## 2020-05-26 NOTE — Progress Notes (Addendum)
Chief Complaint  Patient presents with  . Follow-up    Rm 1 alone Pt is well, BP is high when having migraines, states its 208/104 around times of migraines. Currently having 2-3 a month     HISTORY OF PRESENT ILLNESS: 05/31/20 ALL:  She returns for migraine follow up. We started propranolol 10mg  BID and sumatriptan PRN in 01/2020. She feels that headaches are much better after starting propranolol 10mg  BID. She has tolerated it well. No concerns of breathing difficulty or asthma flares. She was having near daily headaches and now having 2-3 per month. 1-2 are migrainous. Sumatriptan works most of the time. She is not checking her BP routinely. She does report that with migraine BP has been high. Last documented reading was 208/104. No red flag warnings. BP returned to normal with migraine abortion. She scheduled a CPE with PCP in May. No syncopal events.    01/21/2020 ALL: Natasha Giles is a 25 y.o. female here today for follow up for migraines. She feels that she was ing well for a period of time. She reports migraines have increased in frequency over the past month. She has about 4-8 migraines per month. She denies any aura symptoms. Intermittent She was using rizatriptan for abortive therapy. She does not feel that it helped that much so she hasn't taken it recently. She usually uses a heated rice bag that helps. She reports a remote history of exercise induced asthma controlled with as needed Albuterol, last used at age 1 and was told "she grew out of it".  She has never used Albuterol as an adult. She denies respiratory symptoms.   She was recently evaluated by the ER following sudden onset of dizziness and generalized weakness. Coworkers reported that she was awake but not able to respond to them. Similar events have occurred in the past and some reports of slurred speech. She feels shaky and lightheaded. She reports an episode of syncope about two weeks ago at work but did not seek  medical attention. MRI was normal 01/2019. EEG 01/2020 was normal. BP was elevated, highest was 160/98. 130/82 today. She has never had issues with BP in the past. She reports dizziness and hot flashes and is concerned she is hyperthyroid. TSH, T4 normal in ER. She is followed by endocrinology and PCP. She saw endocrinology in July and had a normal check up. She has not seen PCP recently. She is going through a divorce. She does not have a great relationship with her parents. She is switching jobs to work M-F. 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 She does not sleep well. She is taking melatonin QHS.    HISTORY (copied from Dr August note on 01/27/2019)  HPI:  Natasha Giles is a 25 y.o. female here as requested by ER for Migraines.  Past medical history depression, thyroid disease, headaches.  Patient was seen in the emergency room at the end of August, she is an 20 and just after 10 AM she had a sudden onset sharp in quality headache severe worst of life on the right side of her head extending all the way to the occiput which lasted 45 minutes.  Quick onset, she had headaches in the past never that severe and different quality normally in the center of her forehead and duller.  Imaging was negative.  Headache had improved when she was seen. 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 They were not sure whether her pituitary gland was enlarged. The headaches went away for a few years. Within the last 6 months she has had worsening headaches. Most recently she was at work, and all of a sudden something :exploded" in her head just on the right side, in the past she would lose her vision with headaches and this time she had blurry vision but no inciting event and not even a slight headache prior to the headache, holding her head down  made it feel better, she describes a sharp throbbing pain, it lasted that evening, nothing helped she still had the headache back home, t went away after sleeping. Saturday they were out of town and she started getting a migraine, it usually starts behind the eyes, +photo/phonophobia and pounding, sleeping helped and she had nausea, movement made it worse, she felt weak. She loses her vision, it goes "black" for 2 minutes and her speech pattern is impaired. Husband provides much information as well. Headaches are severe.  She has 8 days of headaches a month, 2-3 days of migraines which can be severe. She has not found anything that works for the migraines. Alleve or tylenol. Her cousin has migraines. She was having episodes during sleep, she has twitching in her legs, the last week she was twitching, her arms will move, one night she had her arms up above her like she was conducting, another night jekling her limbs, body tightening up and breathing changing.He sometimes has to hold her to keep her from hitting herself.   Reviewed notes, labs and imaging from outside physicians, which showed:  CT head 12/02/2018 showed No acute intracranial abnormalities including mass lesion or mass effect, hydrocephalus, extra-axial fluid collection, midline shift, hemorrhage, or acute infarction, large ischemic events (personally reviewed images)  CTA: reviewed report: Normal CTA head. Negative for aneurysm, stenosis or large vessel occlusion.  BMP nml, CBC with WBCs 11.5 and plts 409 otherwise normal, neg pregnancy    REVIEW OF SYSTEMS: Out of a complete 14 system review of symptoms, the patient complains only of the following symptoms, migraines, dizziness, anxiety, and all other reviewed systems are negative.   ALLERGIES: Allergies  Allergen Reactions  . Penicillins      HOME MEDICATIONS: Outpatient Medications Prior to Visit  Medication Sig Dispense Refill  . escitalopram (LEXAPRO) 10 MG tablet  Take 1 tablet by mouth daily.    . ondansetron (ZOFRAN-ODT) 4 MG disintegrating tablet Take 1 tablet (4 mg total) by mouth every 8 (eight) hours as needed for nausea. 30 tablet 3  . propranolol (INDERAL) 10 MG tablet Take 1 tablet (10 mg total) by mouth 2 (two) times daily. 180 tablet 3  . SUMAtriptan (IMITREX) 50 MG tablet Take 1 tablet (50 mg total) by mouth every 2 (two) hours as needed for migraine. May repeat in 2 hours if headache persists or recurs. 10 tablet 0   No facility-administered medications prior to visit.     PAST MEDICAL HISTORY: Past Medical History:  Diagnosis Date  . Anxiety   . Asthma   . Depression   . Gastritis and duodenitis   . Migraine   . Multiple thyroid nodules   . Thyroid disease      PAST SURGICAL HISTORY: Past Surgical History:  Procedure Laterality Date  . NO PAST SURGERIES       FAMILY HISTORY: Family  History  Problem Relation Age of Onset  . Hypertension Father   . Diabetes Maternal Grandmother   . Diabetes Maternal Grandfather   . Diabetes Paternal Grandmother   . Renal Disease Paternal Grandmother        ESRD  . Diabetes Paternal Grandfather   . Migraines Cousin      SOCIAL HISTORY: Social History   Socioeconomic History  . Marital status: Married    Spouse name: Not on file  . Number of children: 0  . Years of education: Not on file  . Highest education level: Bachelor's degree (e.g., BA, AB, BS)  Occupational History  . Not on file  Tobacco Use  . Smoking status: Never Smoker  . Smokeless tobacco: Never Used  Vaping Use  . Vaping Use: Never used  Substance and Sexual Activity  . Alcohol use: Yes    Comment: 1-2 per month, occasional  . Drug use: Never  . Sexual activity: Never    Birth control/protection: None  Other Topics Concern  . Not on file  Social History Narrative   Lives at home with husband, Molli Hazard   Right handed   Caffeine: 1 cup coffee/day   Social Determinants of Health   Financial Resource  Strain: Not on file  Food Insecurity: Not on file  Transportation Needs: Not on file  Physical Activity: Not on file  Stress: Not on file  Social Connections: Not on file  Intimate Partner Violence: Not on file      PHYSICAL EXAM  Vitals:   05/31/20 0809  BP: 132/86  Pulse: 79  Weight: 197 lb (89.4 kg)  Height: 5\' 6"  (1.676 m)   Body mass index is 31.8 kg/m.   Generalized: Well developed, in no acute distress   Neurological examination  Mentation: Alert oriented to time, place, history taking. Follows all commands speech and language fluent Cranial nerve II-XII: Pupils were equal round reactive to light. Extraocular movements were full, visual field were full on confrontational test. Facial sensation and strength were normal. Head turning and shoulder shrug  were normal and symmetric. Motor: The motor testing reveals 5 over 5 strength of all 4 extremities. Good symmetric motor tone is noted throughout.  Gait and station: Gait is normal.    DIAGNOSTIC DATA (LABS, IMAGING, TESTING) - I reviewed patient records, labs, notes, testing and imaging myself where available.  Lab Results  Component Value Date   WBC 10.7 (H) 01/18/2020   HGB 12.3 01/18/2020   HCT 37.8 01/18/2020   MCV 90.9 01/18/2020   PLT 424 (H) 01/18/2020      Component Value Date/Time   NA 137 01/18/2020 1448   K 3.6 01/18/2020 1448   CL 103 01/18/2020 1448   CO2 24 01/18/2020 1448   GLUCOSE 98 01/18/2020 1448   BUN 8 01/18/2020 1448   CREATININE 0.79 01/18/2020 1448   CALCIUM 9.1 01/18/2020 1448   GFRNONAA >60 01/18/2020 1448   GFRAA >60 12/02/2018 1142   No results found for: CHOL, HDL, LDLCALC, LDLDIRECT, TRIG, CHOLHDL No results found for: 12/04/2018 No results found for: VITAMINB12 Lab Results  Component Value Date   TSH 0.979 01/18/2020      ASSESSMENT AND PLAN  25 y.o. year old female  has a past medical history of Anxiety, Asthma, Depression, Gastritis and duodenitis, Migraine,  Multiple thyroid nodules, and Thyroid disease. here with   Migraine without aura and without status migrainosus, not intractable   Yari reports headaches have significantly improved. She has 2-3  headache days per month with 1-2 migrainous headaches. Sumatriptan does help but does not always abort migraine. We will increase propranolol to 10mg  TID. She will monitor closely for worsening depression or trouble with asthma flare. We will also call in Nurtec for migraines not responsive to sumatriptan. She was educated on appropriate administration of each medication. Additional information provided in AVS. We have reviewed isolated blood pressure reading og 208/104 with migraine. It is unclear what BP was prior to headache. I have advised that she journal BP 2-3 times weekly. She was encouraged to discuss stress management and elevated BP with PCP. Healthy lifestyle habits encouraged. Low sodium diet advised. She will follow up in 6 months, sooner if needed.    I spent 20 minutes of face-to-face and non-face-to-face time with patient.  This included previsit chart review, lab review, study review, order entry, electronic health record documentation, patient education.    02-28-1991, MSN, FNP-C 05/31/2020, 8:20 AM  [facility] [address], [address] [place], [place] [phone]  Made any corrections needed, and agree with history, physical, neuro exam,assessment and plan as stated.     [phone], MD [facility]

## 2020-05-31 ENCOUNTER — Other Ambulatory Visit: Payer: Self-pay | Admitting: Family Medicine

## 2020-05-31 ENCOUNTER — Encounter: Payer: Self-pay | Admitting: Family Medicine

## 2020-05-31 ENCOUNTER — Other Ambulatory Visit: Payer: Self-pay

## 2020-05-31 ENCOUNTER — Ambulatory Visit: Payer: 59 | Admitting: Family Medicine

## 2020-05-31 VITALS — BP 132/86 | HR 79 | Ht 66.0 in | Wt 197.0 lb

## 2020-05-31 DIAGNOSIS — G43009 Migraine without aura, not intractable, without status migrainosus: Secondary | ICD-10-CM | POA: Diagnosis not present

## 2020-05-31 MED ORDER — SUMATRIPTAN SUCCINATE 50 MG PO TABS: 50.0000 mg | ORAL_TABLET | ORAL | 11 refills | Status: DC | PRN

## 2020-05-31 MED ORDER — PROPRANOLOL HCL 10 MG PO TABS: 10.0000 mg | ORAL_TABLET | Freq: Three times a day (TID) | ORAL | 3 refills | Status: DC

## 2020-05-31 MED ORDER — NURTEC 75 MG PO TBDP: 75.0000 mg | ORAL_TABLET | Freq: Every day | ORAL | 11 refills | Status: DC | PRN

## 2020-05-31 MED FILL — PROPRANOLOL HCL 10 MG TAB: 10 | 90 days supply | Qty: 270 | Fill #0

## 2020-05-31 MED FILL — SUMAtriptan SUCCINATE 50 MG: 50 | 30 days supply | Qty: 10 | Fill #0

## 2020-06-01 ENCOUNTER — Telehealth: Payer: Self-pay

## 2020-06-01 NOTE — Telephone Encounter (Signed)
I submitted a PA request for Nurtec on CMM, Key: [id number].   Awaiting determination from [employer].  Patient has tried: Sumatriptan, Rizatriptan, Propranolol

## 2020-06-01 NOTE — Telephone Encounter (Signed)
The request has been approved. The authorization is effective for a maximum of 6 fills from 06/01/2020 to 11/28/2020, as long as the member is enrolled in their current health plan. This has been approved for a quantity limit of 18.0 with a day supply limit of 30.0.

## 2020-06-06 MED FILL — NURTEC 75 MG TBDP: 75 | 30 days supply | Qty: 8 | Fill #0

## 2020-06-06 MED FILL — ESCITALOPRAM 10 MG TABLET: 10 | 90 days supply | Qty: 135 | Fill #3

## 2020-06-23 ENCOUNTER — Other Ambulatory Visit (HOSPITAL_COMMUNITY): Payer: Self-pay | Admitting: Physician Assistant

## 2020-06-23 DIAGNOSIS — Z01419 Encounter for gynecological examination (general) (routine) without abnormal findings: Secondary | ICD-10-CM | POA: Diagnosis not present

## 2020-06-23 DIAGNOSIS — Z113 Encounter for screening for infections with a predominantly sexual mode of transmission: Secondary | ICD-10-CM | POA: Diagnosis not present

## 2020-06-23 DIAGNOSIS — Z6831 Body mass index (BMI) 31.0-31.9, adult: Secondary | ICD-10-CM | POA: Diagnosis not present

## 2020-06-23 DIAGNOSIS — Z309 Encounter for contraceptive management, unspecified: Secondary | ICD-10-CM | POA: Diagnosis not present

## 2020-07-30 ENCOUNTER — Emergency Department
Admission: RE | Admit: 2020-07-30 | Discharge: 2020-07-30 | Disposition: A | Discharge status: Home / Self Care | Payer: 59 | Source: Ambulatory Visit | Attending: Family Medicine | Admitting: Family Medicine

## 2020-07-30 ENCOUNTER — Other Ambulatory Visit: Payer: Self-pay

## 2020-07-30 VITALS — BP 123/83 | HR 85 | Temp 98.8°F | Resp 16

## 2020-07-30 DIAGNOSIS — J029 Acute pharyngitis, unspecified: Secondary | ICD-10-CM

## 2020-07-30 DIAGNOSIS — J039 Acute tonsillitis, unspecified: Secondary | ICD-10-CM | POA: Diagnosis not present

## 2020-07-30 LAB — POCT RAPID STREP A (OFFICE): Rapid Strep A Screen: NEGATIVE

## 2020-07-30 MED ORDER — DEXAMETHASONE 10 MG/ML FOR PEDIATRIC ORAL USE
10.0000 mg | Freq: Once | INTRAMUSCULAR | Status: AC
  Administered 2020-07-30: 10 mg via ORAL

## 2020-07-30 MED ORDER — TRAMADOL-ACETAMINOPHEN 37.5-325 MG PO TABS: 2.0000 | ORAL_TABLET | Freq: Four times a day (QID) | ORAL | 0 refills | Status: DC | PRN

## 2020-07-30 MED ORDER — CEFDINIR 300 MG PO CAPS: 300.0000 mg | ORAL_CAPSULE | Freq: Two times a day (BID) | ORAL | 0 refills | Status: AC

## 2020-07-30 NOTE — Discharge Instructions (Addendum)
Take the antibiotic 2 times a day.  Take 2 doses today Take Ultracet as needed for pain Continue with sore throat sprays, salt water gargles, hot tea for comfort Drink plenty of fluids Check your culture report in MyChart

## 2020-07-30 NOTE — ED Triage Notes (Signed)
Patient c/o sore throat x 1 day, history of tonsil problems, scheduled for a tonsillectomy on 08/26/20.  Patient has not taken any OTC meds.  Patient is vaccinated.

## 2020-07-31 NOTE — ED Provider Notes (Signed)
[facility]    CSN: [id number] Arrival date & time: 07/30/20  0932      History   Chief Complaint Chief Complaint  Patient presents with  . Appointment    Sore throat    HPI Natasha Giles is a 25 y.o. female.   HPI   Very pleasant 25 year old woman.  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  Is here with tonsillitis.  Has recurring tonsillitis.  Is under the care of her primary care doctor and an ear nose and throat specialist.  Is scheduled to have a tonsillectomy in approximately a month.  Is here with typical tonsil swelling, painful throat, painful swallowing.  No fever or chills.  No headache or body aches.  No nausea or vomiting.  Patient is vaccinated for influenza and COVID.  Patient voices a penicillin allergy.  Patient has history of migraine headaches.  Past Medical History:  Diagnosis Date  . Anxiety   . Asthma   . Depression   . Gastritis and duodenitis   . Migraine   . Multiple thyroid nodules   . Thyroid disease     Patient Active Problem List   Diagnosis Date Noted  . Migraine without aura and without status migrainosus, not intractable 01/27/2019    Past Surgical History:  Procedure Laterality Date  . NO PAST SURGERIES      OB History   No obstetric history on file.      Home Medications    Prior to Admission medications   Medication Sig Start Date End Date Taking? Authorizing Provider  escitalopram (LEXAPRO) 10 MG tablet Take 1 tablet by mouth daily. 01/16/19  Yes [provider]  fluconazole (DIFLUCAN) 150 MG tablet TAKE ONE TABLET (150 MG DOSE) BY MOUTH ONCE FOR 1 DOSE. REPEAT IN 72 HOURS IF NEEDED. 04/06/20 04/06/21 Yes Hairford, Ricki Miller, MD  norethindrone (MICRONOR) 0.35 MG tablet TAKE 1 TABLET BY MOUTH ONCE DAILY 06/23/20 06/23/21 Yes Norm Parcel, PA-C  ondansetron (ZOFRAN-ODT) 4 MG disintegrating tablet Take 1 tablet (4 mg total) by mouth every 8 (eight) hours as needed for nausea. 01/27/19  Yes Anson Fret, MD   propranolol (INDERAL) 10 MG tablet TAKE 1 TABLET BY MOUTH THREE TIMES DAILY. 05/31/20 05/31/21 Yes Lomax, Amy, NP  Rimegepant Sulfate 75 MG TBDP DISSOLVE 1 TABLET BY MOUTH ONCE DAILY AS NEEDED FOR ABORTIVE THERAPY OF MIGRAINE. NO MORE THAN 1 TABLET IN 24 HOURS OR 10 TABLETS PER MONTH. 05/31/20 05/31/21 Yes Lomax, Amy, NP  SUMAtriptan (IMITREX) 50 MG tablet TAKE 1 TABLET BY MOUTH EVERY 2 HOURS AS NEEDED FOR MIGRAINE. MAY REPEAT IN 2 HOURS IF HEADACHE PERSISTS OR RECURS. 05/31/20 05/31/21 Yes Lomax, Amy, NP  traMADol-acetaminophen (ULTRACET) 37.5-325 MG tablet Take 2 tablets by mouth every 6 (six) hours as needed. 07/30/20  Yes Eustace Moore, MD  cefdinir (OMNICEF) 300 MG capsule Take 1 capsule (300 mg total) by mouth 2 (two) times daily for 10 days. 07/30/20 08/09/20  Eustace Moore, MD  escitalopram (LEXAPRO) 10 MG tablet TAKE ONE AND A HALF TABLETS (15 MG DOSE) BY MOUTH DAILY. 07/29/19 07/28/20  Hairford, Ricki Miller, MD  Norgestimate-Ethinyl Estradiol Triphasic 0.18/0.215/0.25 MG-25 MCG tab TAKE 1 TABLET BY MOUTH ONCE A DAY 06/18/19 06/17/20  Norm Parcel, PA-C    Family History Family History  Problem Relation Age of Onset  . Hypertension Father   . Diabetes Maternal Grandmother   . Diabetes Maternal Grandfather   . Diabetes Paternal Grandmother   . Renal  Disease Paternal Grandmother        ESRD  . Diabetes Paternal Grandfather   . Migraines Cousin     Social History Social History   Tobacco Use  . Smoking status: Never Smoker  . Smokeless tobacco: Never Used  Vaping Use  . Vaping Use: Never used  Substance Use Topics  . Alcohol use: Yes    Comment: 1-2 per month, occasional  . Drug use: Never     Allergies   Penicillins   Review of Systems Review of Systems See HPI  Physical Exam Triage Vital Signs ED Triage Vitals  Enc Vitals Group     BP 07/30/20 0946 123/83     Pulse Rate 07/30/20 0946 85     Resp 07/30/20 0951 16     Temp 07/30/20 0946 98.8 F (37.1 C)      Temp Source 07/30/20 0946 Oral     SpO2 07/30/20 0946 99 %     Weight --      Height --      Head Circumference --      Peak Flow --      Pain Score 07/30/20 0948 8     Pain Loc --      Pain Edu? --      Excl. in GC? --    No data found.  Updated Vital Signs BP 123/83 (BP Location: Left Arm)   Pulse 85   Temp 98.8 F (37.1 C) (Oral)   Resp 16   LMP 07/16/2020 (Exact Date)   SpO2 99%      Physical Exam Constitutional:      General: She is not in acute distress.    Appearance: Normal appearance. She is well-developed. She is ill-appearing.  HENT:     Head: Normocephalic and atraumatic.     Right Ear: Tympanic membrane, ear canal and external ear normal.     Left Ear: Tympanic membrane, ear canal and external ear normal.     Nose: No congestion.     Mouth/Throat:     Mouth: Mucous membranes are moist.     Pharynx: Posterior oropharyngeal erythema present.     Comments: Tonsils are enlarged.  Deeply erythematous.  Coated with exudate. Eyes:     Conjunctiva/sclera: Conjunctivae normal.     Pupils: Pupils are equal, round, and reactive to light.  Cardiovascular:     Rate and Rhythm: Normal rate and regular rhythm.     Heart sounds: Normal heart sounds.  Pulmonary:     Effort: Pulmonary effort is normal. No respiratory distress.     Breath sounds: Normal breath sounds.  Abdominal:     General: There is no distension.     Palpations: Abdomen is soft.  Musculoskeletal:        General: Normal range of motion.     Cervical back: Normal range of motion.  Lymphadenopathy:     Cervical: Cervical adenopathy present.  Skin:    General: Skin is warm and dry.  Neurological:     Mental Status: She is alert.  Psychiatric:        Behavior: Behavior normal.      UC Treatments / Results  Labs (all labs ordered are listed, but only abnormal results are displayed) Labs Reviewed  CULTURE, GROUP A STREP  POCT RAPID STREP A (OFFICE)    EKG   Radiology No results  found.  Procedures Procedures (including critical care time)  Medications Ordered in UC Medications  dexamethasone (DECADRON) 10 MG/ML  injection for Pediatric ORAL use 10 mg (10 mg Oral Given 07/30/20 1027)    Initial Impression / Assessment and Plan / UC Course  I have reviewed the triage vital signs and the nursing notes.  Pertinent labs & imaging results that were available during my care of the patient were reviewed by me and considered in my medical decision making (see chart for details).     Rapid strep is negative.  Because of her recurring tonsillitis, often with strep, and the clinical appearance will cover with antibiotics.  She understands to stop antibiotics if her throat culture is negative and she sees improvement.  Symptomatic care reviewed Final Clinical Impressions(s) / UC Diagnoses   Final diagnoses:  Sore throat  Acute tonsillitis, unspecified etiology     Discharge Instructions     Take the antibiotic 2 times a day.  Take 2 doses today Take Ultracet as needed for pain Continue with sore throat sprays, salt water gargles, hot tea for comfort Drink plenty of fluids Check your culture report in MyChart   ED Prescriptions    Medication Sig Dispense Auth. Provider   cefdinir (OMNICEF) 300 MG capsule Take 1 capsule (300 mg total) by mouth 2 (two) times daily for 10 days. 20 capsule Eustace Moore, MD   traMADol-acetaminophen (ULTRACET) 37.5-325 MG tablet Take 2 tablets by mouth every 6 (six) hours as needed. 20 tablet Eustace Moore, MD     I have reviewed the PDMP during this encounter.   Eustace Moore, MD 07/31/20 [phone]

## 2020-08-01 LAB — CULTURE, GROUP A STREP: Strep A Culture: NEGATIVE

## 2020-08-05 ENCOUNTER — Other Ambulatory Visit (HOSPITAL_COMMUNITY): Payer: Self-pay

## 2020-08-05 MED ORDER — HYDROCODONE-ACETAMINOPHEN 5-325 MG PO TABS
1.0000 | ORAL_TABLET | Freq: Four times a day (QID) | ORAL | 0 refills | Status: DC | PRN
  Filled 2020-08-05: qty 8, 2d supply, fill #0

## 2020-08-05 MED ORDER — CLINDAMYCIN HCL 300 MG PO CAPS
300.0000 mg | ORAL_CAPSULE | Freq: Three times a day (TID) | ORAL | 0 refills | Status: DC
  Filled 2020-08-05: qty 21, 7d supply, fill #0

## 2020-08-05 MED ORDER — IBUPROFEN 600 MG PO TABS
600.0000 mg | ORAL_TABLET | Freq: Four times a day (QID) | ORAL | 0 refills | Status: DC | PRN
  Filled 2020-08-05: qty 20, 5d supply, fill #0

## 2020-08-08 ENCOUNTER — Other Ambulatory Visit (HOSPITAL_COMMUNITY): Payer: Self-pay

## 2020-08-08 DIAGNOSIS — J0391 Acute recurrent tonsillitis, unspecified: Secondary | ICD-10-CM | POA: Diagnosis not present

## 2020-08-08 DIAGNOSIS — E042 Nontoxic multinodular goiter: Secondary | ICD-10-CM | POA: Diagnosis not present

## 2020-08-08 DIAGNOSIS — Z0000 Encounter for general adult medical examination without abnormal findings: Secondary | ICD-10-CM | POA: Diagnosis not present

## 2020-08-08 DIAGNOSIS — F321 Major depressive disorder, single episode, moderate: Secondary | ICD-10-CM | POA: Diagnosis not present

## 2020-08-08 DIAGNOSIS — Z98818 Other dental procedure status: Secondary | ICD-10-CM | POA: Diagnosis not present

## 2020-08-08 DIAGNOSIS — F5101 Primary insomnia: Secondary | ICD-10-CM | POA: Diagnosis not present

## 2020-08-08 DIAGNOSIS — E221 Hyperprolactinemia: Secondary | ICD-10-CM | POA: Diagnosis not present

## 2020-08-08 DIAGNOSIS — Z13220 Encounter for screening for lipoid disorders: Secondary | ICD-10-CM | POA: Diagnosis not present

## 2020-08-08 DIAGNOSIS — G43709 Chronic migraine without aura, not intractable, without status migrainosus: Secondary | ICD-10-CM | POA: Diagnosis not present

## 2020-08-08 MED ORDER — AMITRIPTYLINE HCL 25 MG PO TABS
25.0000 mg | ORAL_TABLET | Freq: Every day | ORAL | 2 refills | Status: DC
  Filled 2020-08-08: qty 30, 30d supply, fill #0
  Filled 2020-11-04: qty 30, 30d supply, fill #1
  Filled 2020-12-29: qty 30, 30d supply, fill #2

## 2020-08-08 MED ORDER — ESCITALOPRAM OXALATE 20 MG PO TABS
20.0000 mg | ORAL_TABLET | Freq: Every day | ORAL | 3 refills | Status: DC
  Filled 2020-08-08: qty 90, 90d supply, fill #0
  Filled 2020-12-29: qty 90, 90d supply, fill #1
  Filled 2021-04-17: qty 90, 90d supply, fill #2

## 2020-08-22 ENCOUNTER — Other Ambulatory Visit (HOSPITAL_COMMUNITY): Payer: Self-pay

## 2020-08-22 DIAGNOSIS — J3501 Chronic tonsillitis: Secondary | ICD-10-CM | POA: Diagnosis not present

## 2020-08-22 MED ORDER — FLUCONAZOLE 150 MG PO TABS
150.0000 mg | ORAL_TABLET | ORAL | 0 refills | Status: DC
  Filled 2020-08-22: qty 2, 3d supply, fill #0

## 2020-08-23 ENCOUNTER — Other Ambulatory Visit (HOSPITAL_COMMUNITY): Payer: Self-pay

## 2020-08-23 MED ORDER — OXYCODONE-ACETAMINOPHEN 5-325 MG PO TABS
2.0000 | ORAL_TABLET | Freq: Four times a day (QID) | ORAL | 0 refills | Status: DC | PRN
  Filled 2020-08-23: qty 32, 4d supply, fill #0

## 2020-08-26 ENCOUNTER — Other Ambulatory Visit (HOSPITAL_COMMUNITY): Payer: Self-pay

## 2020-08-26 DIAGNOSIS — J351 Hypertrophy of tonsils: Secondary | ICD-10-CM | POA: Diagnosis not present

## 2020-08-26 DIAGNOSIS — J358 Other chronic diseases of tonsils and adenoids: Secondary | ICD-10-CM | POA: Diagnosis not present

## 2020-08-26 DIAGNOSIS — J3501 Chronic tonsillitis: Secondary | ICD-10-CM | POA: Diagnosis not present

## 2020-09-19 DIAGNOSIS — B085 Enteroviral vesicular pharyngitis: Secondary | ICD-10-CM | POA: Diagnosis not present

## 2020-09-19 DIAGNOSIS — J029 Acute pharyngitis, unspecified: Secondary | ICD-10-CM | POA: Diagnosis not present

## 2020-09-26 ENCOUNTER — Other Ambulatory Visit (HOSPITAL_BASED_OUTPATIENT_CLINIC_OR_DEPARTMENT_OTHER): Payer: Self-pay

## 2020-10-14 ENCOUNTER — Other Ambulatory Visit (HOSPITAL_COMMUNITY): Payer: Self-pay

## 2020-10-14 MED FILL — Norethindrone Tab 0.35 MG: ORAL | 84 days supply | Qty: 84 | Fill #0 | Status: AC

## 2020-11-04 MED FILL — Propranolol HCl Tab 10 MG: ORAL | 90 days supply | Qty: 270 | Fill #0 | Status: AC

## 2020-11-07 ENCOUNTER — Other Ambulatory Visit (HOSPITAL_COMMUNITY): Payer: Self-pay

## 2020-11-10 ENCOUNTER — Other Ambulatory Visit (HOSPITAL_COMMUNITY): Payer: Self-pay

## 2020-11-16 ENCOUNTER — Telehealth: Payer: Self-pay

## 2020-11-16 NOTE — Telephone Encounter (Signed)
(  Key: [id number])  Your information has been sent to [employer].  PA for Charissa Bash has been sent and received instant approval.  The request has been approved. The authorization is effective for a maximum of 12 fills from 11/16/2020 to 11/15/2021, as long as the member is enrolled in their current health plan. The request was approved with a quantity restriction. This has been approved for a quantity limit of 18 with a day supply limit of 30. A written notification letter will follow with additional details.

## 2020-11-17 NOTE — Telephone Encounter (Signed)
The request has been approved. The authorization is effective for a maximum of 12 fills from 11/16/2020 to 11/15/2021, as long as the member is enrolled in their current health plan. The request was approved with a quantity restriction. This has been approved for a quantity limit of 18 with a day supply limit of 30. A written notification letter will follow with additional details.

## 2020-11-24 NOTE — Patient Instructions (Signed)
Below is our plan:  We will continue propranolol 30mg  at bedtime and Nurtec as needed.   Please make sure you are staying well hydrated. I recommend 50-60 ounces daily. Well balanced diet and regular exercise encouraged. Consistent sleep schedule with 6-8 hours recommended.   Please continue follow up with care team as directed.   Follow up with me in 1 year   You may receive a survey regarding today's visit. I encourage you to leave honest feed back as I do use this information to improve patient care. Thank you for seeing me today!

## 2020-11-24 NOTE — Progress Notes (Signed)
Chief Complaint  Patient presents with   Follow-up    Pt alone, rm 11. Overall has seen a decrease in migraines. She states avg about 2 migraines in a month. Did have a migraine last night thinks was due to weather.      HISTORY OF PRESENT ILLNESS: 11/28/20 ALL:  Natasha Giles returns for follow up for migraines. She continues propranolol 30mg  at bedtime and Nurtec as needed. She is doing very well. May have 5 headache days per month, 2 are migrainous. Nurtec works great for abortive therapy. She has tolerated propranolol well. BP is normal.   05/31/2020 ALL:  She returns for migraine follow up. We started propranolol 10mg  BID and sumatriptan PRN in 01/2020. She feels that headaches are much better after starting propranolol 10mg  BID. She has tolerated it well. No concerns of breathing difficulty or asthma flares. She was having near daily headaches and now having 2-3 per month. 1-2 are migrainous. Sumatriptan works most of the time. She is not checking her BP routinely. She does report that with migraine BP has been high. Last documented reading was 208/104. No red flag warnings. BP returned to normal with migraine abortion. She scheduled a CPE with PCP in May. No syncopal events.    01/21/2020 ALL: Natasha Giles is a 25 y.o. female here today for follow up for migraines. She feels that she was ing well for a period of time. She reports migraines have increased in frequency over the past month. She has about 4-8 migraines per month. She denies any aura symptoms. Intermittent She was using rizatriptan for abortive therapy. She does not feel that it helped that much so she hasn't taken it recently. She usually uses a heated rice bag that helps. She reports a remote history of exercise induced asthma controlled with as needed Albuterol, last used at age 76 and was told "she grew out of it".  She has never used Albuterol as an adult. She denies respiratory symptoms.   She was recently evaluated by the  ER following sudden onset of dizziness and generalized weakness. Coworkers reported that she was awake but not able to respond to them. Similar events have occurred in the past and some reports of slurred speech. She feels shaky and lightheaded. She reports an episode of syncope about two weeks ago at work but did not seek medical attention. MRI was normal 01/2019. EEG 01/2020 was normal. BP was elevated, highest was 160/98. 130/82 today. She has never had issues with BP in the past. She reports dizziness and hot flashes and is concerned she is hyperthyroid. TSH, T4 normal in ER. She is followed by endocrinology and PCP. She saw endocrinology in July and had a normal check up. She has not seen PCP recently. She is going through a divorce. She does not have a great relationship with her parents. She is switching jobs to work M-F. 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 She does not sleep well. She is taking melatonin QHS.    HISTORY (copied from Dr 02/2020 note on 01/27/2019)  HPI:  Natasha Giles is a 25 y.o. female here as requested by ER for Migraines.  Past medical history depression, thyroid disease, headaches.  Patient was seen in the emergency room at the end of August, she is an Natasha Giles and just after 10 AM she had a sudden onset sharp in quality headache severe worst of life on the right side of her head extending all  the way to the occiput which lasted 45 minutes.  Quick onset, she had headaches in the past never that severe and different quality normally in the center of her forehead and duller.  Imaging was negative.  Headache had improved when she was seen. 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 They were not sure whether her pituitary gland was enlarged. The headaches went away for a few years. Within the last 6 months she  has had worsening headaches. Most recently she was at work, and all of a sudden something :exploded" in her head just on the right side, in the past she would lose her vision with headaches and this time she had blurry vision but no inciting event and not even a slight headache prior to the headache, holding her head down made it feel better, she describes a sharp throbbing pain, it lasted that evening, nothing helped she still had the headache back home, t went away after sleeping. Saturday they were out of town and she started getting a migraine, it usually starts behind the eyes, +photo/phonophobia and pounding, sleeping helped and she had nausea, movement made it worse, she felt weak. She loses her vision, it goes "black" for 2 minutes and her speech pattern is impaired. Husband provides much information as well. Headaches are severe.  She has 8 days of headaches a month, 2-3 days of migraines which can be severe. She has not found anything that works for the migraines. Alleve or tylenol. Her cousin has migraines. She was having episodes during sleep, she has twitching in her legs, the last week she was twitching, her arms will move, one night she had her arms up above her like she was conducting, another night jekling her limbs, body tightening up and breathing changing.He sometimes has to hold her to keep her from hitting herself.    Reviewed notes, labs and imaging from outside physicians, which showed:   CT head 12/02/2018 showed No acute intracranial abnormalities including mass lesion or mass effect, hydrocephalus, extra-axial fluid collection, midline shift, hemorrhage, or acute infarction, large ischemic events (personally reviewed images)   CTA: reviewed report: Normal CTA head. Negative for aneurysm, stenosis or large vessel occlusion.   BMP nml, CBC with WBCs 11.5 and plts 409 otherwise normal, neg pregnancy  REVIEW OF SYSTEMS: Out of a complete 14 system review of symptoms, the patient  complains only of the following symptoms, migraines, dizziness, anxiety, and all other reviewed systems are negative.   ALLERGIES: Allergies  Allergen Reactions   Penicillins      HOME MEDICATIONS: Outpatient Medications Prior to Visit  Medication Sig Dispense Refill   amitriptyline (ELAVIL) 25 MG tablet Take 1 tablet (25 mg total) by mouth at bedtime. 30 tablet 2   escitalopram (LEXAPRO) 20 MG tablet Take 1 tablet (20 mg total) by mouth daily. 90 tablet 3   ibuprofen (ADVIL) 600 MG tablet Take 1 tablet (600 mg total) by mouth every 6 (six) hours as needed. 20 tablet 0   norethindrone (MICRONOR) 0.35 MG tablet TAKE 1 TABLET BY MOUTH ONCE DAILY 84 tablet 3   ondansetron (ZOFRAN-ODT) 4 MG disintegrating tablet Take 1 tablet (4 mg total) by mouth every 8 (eight) hours as needed for nausea. 30 tablet 3   propranolol (INDERAL) 10 MG tablet TAKE 1 TABLET BY MOUTH THREE TIMES  DAILY. 270 tablet 3   Rimegepant Sulfate 75 MG TBDP DISSOLVE 1 TABLET BY MOUTH ONCE DAILY AS NEEDED FOR ABORTIVE THERAPY OF MIGRAINE. NO MORE THAN 1 TABLET IN 24 HOURS OR 10 TABLETS PER MONTH. 8 tablet 11   SUMAtriptan (IMITREX) 50 MG tablet TAKE 1 TABLET BY MOUTH EVERY 2 HOURS AS NEEDED FOR MIGRAINE. MAY REPEAT IN 2 HOURS IF HEADACHE PERSISTS OR RECURS. (Patient not taking: Reported on 11/28/2020) 10 tablet 11   clindamycin (CLEOCIN) 300 MG capsule Take 1 capsule (300 mg total) by mouth 3 (three) times daily until gone. (every 8 hours) 21 capsule 0   escitalopram (LEXAPRO) 10 MG tablet Take 1 tablet by mouth daily.     escitalopram (LEXAPRO) 10 MG tablet TAKE ONE AND A HALF TABLETS (15 MG DOSE) BY MOUTH DAILY. 135 tablet 3   fluconazole (DIFLUCAN) 150 MG tablet TAKE ONE TABLET (150 MG DOSE) BY MOUTH ONCE FOR 1 DOSE. REPEAT IN 72 HOURS IF NEEDED. 2 tablet 0   fluconazole (DIFLUCAN) 150 MG tablet Take 1 tablet (150 mg dose) by mouth once for 1 dose. Repeat in 72 hours if needed. 2 tablet 0   HYDROcodone-acetaminophen  (NORCO/VICODIN) 5-325 MG tablet Take 1 tablet by mouth every 6 (six) hours as needed for pain. 8 tablet 0   Norgestimate-Ethinyl Estradiol Triphasic 0.18/0.215/0.25 MG-25 MCG tab TAKE 1 TABLET BY MOUTH ONCE A DAY 84 tablet 3   oxyCODONE-acetaminophen (PERCOCET/ROXICET) 5-325 MG tablet Take 2 tablets by mouth every 6 (six) hours as needed for pain. 32 tablet 0   traMADol-acetaminophen (ULTRACET) 37.5-325 MG tablet Take 2 tablets by mouth every 6 (six) hours as needed. 20 tablet 0   No facility-administered medications prior to visit.     PAST MEDICAL HISTORY: Past Medical History:  Diagnosis Date   Anxiety    Asthma    Depression    Gastritis and duodenitis    Migraine    Multiple thyroid nodules    Thyroid disease      PAST SURGICAL HISTORY: Past Surgical History:  Procedure Laterality Date   NO PAST SURGERIES       FAMILY HISTORY: Family History  Problem Relation Age of Onset   Hypertension Father    Diabetes Maternal Grandmother    Diabetes Maternal Grandfather    Diabetes Paternal Grandmother    Renal Disease Paternal Grandmother        ESRD   Diabetes Paternal Grandfather    Migraines Cousin      SOCIAL HISTORY: Social History   Socioeconomic History   Marital status: Married    Spouse name: Not on file   Number of children: 0   Years of education: Not on file   Highest education level: Bachelor's degree (e.g., BA, AB, BS)  Occupational History   Not on file  Tobacco Use   Smoking status: Never   Smokeless tobacco: Never  Vaping Use   Vaping Use: Never used  Substance and Sexual Activity   Alcohol use: Yes    Comment: 1-2 per month, occasional   Drug use: Never   Sexual activity: Never    Birth control/protection: None  Other Topics Concern   Not on file  Social History Narrative   Lives at home with husband, Molli Hazard   Right handed   Caffeine: 1 cup coffee/day   Social Determinants of Health   Financial Resource Strain: Not on file  Food  Insecurity: Not on file  Transportation Needs: Not on file  Physical Activity: Not  on file  Stress: Not on file  Social Connections: Not on file  Intimate Partner Violence: Not on file      PHYSICAL EXAM  Vitals:   11/28/20 0827  BP: 117/82  Pulse: 72  Weight: 186 lb (84.4 kg)  Height: 5\' 6"  (1.676 m)    Body mass index is 30.02 kg/m.   Generalized: Well developed, in no acute distress   Neurological examination  Mentation: Alert oriented to time, place, history taking. Follows all commands speech and language fluent Cranial nerve II-XII: Pupils were equal round reactive to light. Extraocular movements were full, visual field were full on confrontational test. Facial sensation and strength were normal. Head turning and shoulder shrug  were normal and symmetric. Motor: The motor testing reveals 5 over 5 strength of all 4 extremities. Good symmetric motor tone is noted throughout.  Gait and station: Gait is normal.    DIAGNOSTIC DATA (LABS, IMAGING, TESTING) - I reviewed patient records, labs, notes, testing and imaging myself where available.  Lab Results  Component Value Date   WBC 10.7 (H) 01/18/2020   HGB 12.3 01/18/2020   HCT 37.8 01/18/2020   MCV 90.9 01/18/2020   PLT 424 (H) 01/18/2020      Component Value Date/Time   NA 137 01/18/2020 1448   K 3.6 01/18/2020 1448   CL 103 01/18/2020 1448   CO2 24 01/18/2020 1448   GLUCOSE 98 01/18/2020 1448   BUN 8 01/18/2020 1448   CREATININE 0.79 01/18/2020 1448   CALCIUM 9.1 01/18/2020 1448   GFRNONAA >60 01/18/2020 1448   GFRAA >60 12/02/2018 1142   No results found for: CHOL, HDL, LDLCALC, LDLDIRECT, TRIG, CHOLHDL No results found for: 12/04/2018 No results found for: VITAMINB12 Lab Results  Component Value Date   TSH 0.979 01/18/2020      ASSESSMENT AND PLAN  25 y.o. year old female  has a past medical history of Anxiety, Asthma, Depression, Gastritis and duodenitis, Migraine, Multiple thyroid nodules, and  Thyroid disease. here with   Migraine without aura and without status migrainosus, not intractable  Deissy reports headaches have significantly improved. We will continue propranolol 30mg  daily and Nurtec for abortive therapy. Healthy lifestyle habits encouraged. Low sodium diet advised. She will follow up in 1 year, sooner if needed.    34, MSN, FNP-C 11/28/2020, 8:30 AM  [facility] [address] [place], [address] [place] [phone]

## 2020-11-28 ENCOUNTER — Ambulatory Visit: Payer: 59 | Admitting: Family Medicine

## 2020-11-28 ENCOUNTER — Encounter: Payer: Self-pay | Admitting: Family Medicine

## 2020-11-28 ENCOUNTER — Other Ambulatory Visit: Payer: Self-pay

## 2020-11-28 ENCOUNTER — Other Ambulatory Visit (HOSPITAL_COMMUNITY): Payer: Self-pay

## 2020-11-28 VITALS — BP 117/82 | HR 72 | Ht 66.0 in | Wt 186.0 lb

## 2020-11-28 DIAGNOSIS — G43009 Migraine without aura, not intractable, without status migrainosus: Secondary | ICD-10-CM

## 2020-11-28 MED ORDER — PROPRANOLOL HCL 10 MG PO TABS
10.0000 mg | ORAL_TABLET | Freq: Three times a day (TID) | ORAL | 3 refills | Status: DC
  Filled 2020-11-28: qty 270, fill #0
  Filled 2021-03-08: qty 270, 90d supply, fill #0
  Filled 2021-08-10: qty 270, 90d supply, fill #1

## 2020-11-28 MED ORDER — RIMEGEPANT SULFATE 75 MG PO TBDP
ORAL_TABLET | ORAL | 11 refills | Status: DC
  Filled 2020-11-28: qty 8, 30d supply, fill #0

## 2020-12-29 ENCOUNTER — Other Ambulatory Visit (HOSPITAL_COMMUNITY): Payer: Self-pay

## 2020-12-29 DIAGNOSIS — E069 Thyroiditis, unspecified: Secondary | ICD-10-CM | POA: Diagnosis not present

## 2020-12-29 DIAGNOSIS — E221 Hyperprolactinemia: Secondary | ICD-10-CM | POA: Diagnosis not present

## 2021-01-10 ENCOUNTER — Other Ambulatory Visit (HOSPITAL_COMMUNITY): Payer: Self-pay

## 2021-01-10 MED FILL — Norethindrone Tab 0.35 MG: ORAL | 84 days supply | Qty: 84 | Fill #1 | Status: AC

## 2021-02-13 ENCOUNTER — Other Ambulatory Visit (HOSPITAL_COMMUNITY): Payer: Self-pay

## 2021-02-13 DIAGNOSIS — F321 Major depressive disorder, single episode, moderate: Secondary | ICD-10-CM | POA: Diagnosis not present

## 2021-02-13 DIAGNOSIS — E042 Nontoxic multinodular goiter: Secondary | ICD-10-CM | POA: Diagnosis not present

## 2021-02-13 DIAGNOSIS — F5101 Primary insomnia: Secondary | ICD-10-CM | POA: Diagnosis not present

## 2021-02-13 MED ORDER — AMITRIPTYLINE HCL 25 MG PO TABS
25.0000 mg | ORAL_TABLET | Freq: Every day | ORAL | 3 refills | Status: DC
  Filled 2021-02-13 – 2021-03-08 (×2): qty 90, 90d supply, fill #0
  Filled 2021-08-10: qty 90, 90d supply, fill #1

## 2021-02-21 ENCOUNTER — Other Ambulatory Visit (HOSPITAL_COMMUNITY): Payer: Self-pay

## 2021-03-07 DIAGNOSIS — E221 Hyperprolactinemia: Secondary | ICD-10-CM | POA: Diagnosis not present

## 2021-03-08 ENCOUNTER — Other Ambulatory Visit (HOSPITAL_COMMUNITY): Payer: Self-pay

## 2021-03-08 MED ORDER — CABERGOLINE 0.5 MG PO TABS
ORAL_TABLET | ORAL | 1 refills | Status: DC
  Filled 2021-03-08: qty 6, 84d supply, fill #0

## 2021-04-04 ENCOUNTER — Other Ambulatory Visit (HOSPITAL_COMMUNITY): Payer: Self-pay

## 2021-04-04 MED FILL — Norethindrone Tab 0.35 MG: ORAL | 84 days supply | Qty: 84 | Fill #2 | Status: AC

## 2021-04-17 ENCOUNTER — Other Ambulatory Visit (HOSPITAL_COMMUNITY): Payer: Self-pay

## 2021-05-30 ENCOUNTER — Other Ambulatory Visit (HOSPITAL_COMMUNITY): Payer: Self-pay

## 2021-05-30 MED ORDER — FLUCONAZOLE 150 MG PO TABS
150.0000 mg | ORAL_TABLET | ORAL | 0 refills | Status: DC
  Filled 2021-05-30: qty 2, 3d supply, fill #0

## 2021-06-08 ENCOUNTER — Other Ambulatory Visit: Payer: Self-pay

## 2021-06-08 ENCOUNTER — Other Ambulatory Visit (HOSPITAL_COMMUNITY)
Admission: RE | Admit: 2021-06-08 | Discharge: 2021-06-08 | Disposition: A | Discharge status: Home / Self Care | Payer: 59 | Source: Ambulatory Visit | Attending: Family Medicine | Admitting: Family Medicine

## 2021-06-08 ENCOUNTER — Emergency Department
Admission: RE | Admit: 2021-06-08 | Discharge: 2021-06-08 | Disposition: A | Discharge status: Home / Self Care | Payer: 59 | Source: Ambulatory Visit

## 2021-06-08 VITALS — BP 123/85 | HR 89 | Temp 98.1°F | Resp 20 | Ht 66.0 in | Wt 198.0 lb

## 2021-06-08 DIAGNOSIS — Z202 Contact with and (suspected) exposure to infections with a predominantly sexual mode of transmission: Secondary | ICD-10-CM

## 2021-06-08 NOTE — ED Provider Notes (Signed)
?[facility] ? ? ? ?CSN: [id number] ?Arrival date & time: 06/08/21  2094 ? ? ?  ? ?History   ?Chief Complaint ?Chief Complaint  ?Patient presents with  ? Rash  ? ? ?HPI ?Natasha Giles is a 26 y.o. female.  ? ?HPI 26 year old female presents with rash of perineum/anus x 1 week.  Patient reports having yeast infection roughly 1.5 weeks ago and taking Diflucan which is now resolved.  Reports unprotected sex with same partner.  PMH significant for asthma and migraine. ? ?Past Medical History:  ?Diagnosis Date  ? Anxiety   ? Asthma   ? Depression   ? Gastritis and duodenitis   ? Migraine   ? Multiple thyroid nodules   ? Thyroid disease   ? ? ?Patient Active Problem List  ? Diagnosis Date Noted  ? Migraine without aura and without status migrainosus, not intractable 01/27/2019  ? ? ?Past Surgical History:  ?Procedure Laterality Date  ? NO PAST SURGERIES    ? TONSILLECTOMY    ? ? ?OB History   ?No obstetric history on file. ?  ? ? ? ?Home Medications   ? ?Prior to Admission medications   ?Medication Sig Start Date End Date Taking? Authorizing Provider  ?amitriptyline (ELAVIL) 25 MG tablet Take 1 tablet (25 mg total) by mouth at bedtime. 02/13/21     ?cabergoline (DOSTINEX) 0.5 MG tablet Take one half tablet (0.25 mg dose) by mouth once a week. 03/08/21     ?escitalopram (LEXAPRO) 20 MG tablet Take 1 tablet (20 mg total) by mouth daily. 08/08/20     ?fluconazole (DIFLUCAN) 150 MG tablet Take 1 tablet (150 mg total) by mouth once for 1 dose. May repeat in 72 hours if needed 05/30/21     ?ibuprofen (ADVIL) 600 MG tablet Take 1 tablet (600 mg total) by mouth every 6 (six) hours as needed. 08/05/20     ?norethindrone (MICRONOR) 0.35 MG tablet TAKE 1 TABLET BY MOUTH ONCE DAILY 06/23/20 06/28/21  Norm Parcel, PA-C  ?ondansetron (ZOFRAN-ODT) 4 MG disintegrating tablet Take 1 tablet (4 mg total) by mouth every 8 (eight) hours as needed for nausea. 01/27/19   Anson Fret, MD  ?propranolol (INDERAL) 10 MG tablet Take 1  tablet (10 mg total) by mouth 3 (three) times daily. 11/28/20   Shawnie Dapper, NP  ?Rimegepant Sulfate 75 MG TBDP DISSOLVE 1 TABLET BY MOUTH ONCE DAILY AS NEEDED FOR ABORTIVE THERAPY OF MIGRAINE. NO MORE THAN 1 TABLET IN 24 HOURS OR 10 TABLETS PER MONTH. 11/28/20 11/28/21  Shawnie Dapper, NP  ? ? ?Family History ?Family History  ?Problem Relation Age of Onset  ? Healthy Mother   ? Hypertension Father   ? Diabetes Maternal Grandmother   ? Diabetes Maternal Grandfather   ? Diabetes Paternal Grandmother   ? Renal Disease Paternal Grandmother   ?     ESRD  ? Diabetes Paternal Grandfather   ? Migraines Cousin   ? ? ?Social History ?Social History  ? ?Tobacco Use  ? Smoking status: Never  ? Smokeless tobacco: Never  ?Vaping Use  ? Vaping Use: Never used  ?Substance Use Topics  ? Alcohol use: Yes  ?  Comment: 1-2 per month, occasional  ? Drug use: Never  ? ? ? ?Allergies   ?Penicillins ? ? ?Review of Systems ?Review of Systems  ?Skin:  Positive for rash.  ?     Rash of perineal area x1 week  ?All other systems reviewed and are negative. ? ? ?  Physical Exam ?Triage Vital Signs ?ED Triage Vitals  ?Enc Vitals Group  ?   BP   ?   Pulse   ?   Resp   ?   Temp   ?   Temp src   ?   SpO2   ?   Weight   ?   Height   ?   Head Circumference   ?   Peak Flow   ?   Pain Score   ?   Pain Loc   ?   Pain Edu?   ?   Excl. in GC?   ? ?No data found. ? ?Updated Vital Signs ?BP 123/85 (BP Location: Right Arm)   Pulse 89   Temp 98.1 ?F (36.7 ?C) (Oral)   Resp 20   Ht 5\' 6"  (1.676 m)   Wt 198 lb (89.8 kg)   LMP 05/17/2021 (Exact Date)   SpO2 97%   BMI 31.96 kg/m?  ? ? ?Physical Exam ?Vitals and nursing note reviewed.  ?Constitutional:   ?   General: She is not in acute distress. ?   Appearance: Normal appearance. She is obese. She is not ill-appearing.  ?HENT:  ?   Head: Normocephalic and atraumatic.  ?   Mouth/Throat:  ?   Mouth: Mucous membranes are moist.  ?   Pharynx: Oropharynx is clear.  ?Eyes:  ?   Extraocular Movements: Extraocular movements  intact.  ?   Conjunctiva/sclera: Conjunctivae normal.  ?   Pupils: Pupils are equal, round, and reactive to light.  ?Cardiovascular:  ?   Rate and Rhythm: Normal rate and regular rhythm.  ?   Pulses: Normal pulses.  ?   Heart sounds: Normal heart sounds.  ?Pulmonary:  ?   Effort: Pulmonary effort is normal.  ?   Breath sounds: Normal breath sounds.  ?Genitourinary: ?   General: Normal vulva.  ?   Rectum: Normal.  ?   Comments: Proximal peroneal area:~ 10-12 tiny mildly erythematous skin fissures noted, nonindurated, nonfluctuant, no lymphatic streaking; HSV-2 viral culture swab taken of these areas per patient request ?Musculoskeletal:  ?   Cervical back: Normal range of motion and neck supple.  ?Skin: ?   General: Skin is warm and dry.  ?Neurological:  ?   General: No focal deficit present.  ?   Mental Status: She is alert and oriented to person, place, and time.  ? ? ? ?UC Treatments / Results  ?Labs ?(all labs ordered are listed, but only abnormal results are displayed) ?Labs Reviewed  ?HERPES SIMPLEX VIRUS CULTURE  ?HIV ANTIBODY (ROUTINE TESTING W REFLEX)  ?RPR  ?HEPATITIS C ANTIBODY  ?CERVICOVAGINAL ANCILLARY ONLY  ? ? ?EKG ? ? ?Radiology ?No results found. ? ?Procedures ?Procedures (including critical care time) ? ?Medications Ordered in UC ?Medications - No data to display ? ?Initial Impression / Assessment and Plan / UC Course  ?I have reviewed the triage vital signs and the nursing notes. ? ?Pertinent labs & imaging results that were available during my care of the patient were reviewed by me and considered in my medical decision making (see chart for details). ? ?  ? ?MDM: 1.  Potential exposure to STD-Aptima swab ordered, RPR, HIV I, II reflexes, hep C, and HSV-2 viral culture ordered. Advised patient we will follow-up with her once lab results return.  Patient discharged home, hemodynamically stable. ?Final Clinical Impressions(s) / UC Diagnoses  ? ?Final diagnoses:  ?Potential exposure to STD   ? ? ? ?Discharge  Instructions   ? ?  ?Advised patient we will follow-up with her once lab results return.  ? ? ? ? ?ED Prescriptions   ?None ?  ? ?PDMP not reviewed this encounter. ?  ?Trevor Iha, FNP ?06/08/21 1005 ? ?

## 2021-06-08 NOTE — ED Triage Notes (Addendum)
Pt presents to Urgent Care with c/o rash around anus x 1 week. Reports having a yeast infection 1.5 weeks ago and taking Diflucan for this--now resolved. Reports unprotected sex w/ same partner.  ?

## 2021-06-08 NOTE — Discharge Instructions (Addendum)
Advised patient we will follow-up with her once lab results return.  ?

## 2021-06-09 LAB — RPR: RPR Ser Ql: NONREACTIVE

## 2021-06-09 LAB — CERVICOVAGINAL ANCILLARY ONLY
Bacterial Vaginitis (gardnerella): NEGATIVE
Candida Glabrata: NEGATIVE
Candida Vaginitis: NEGATIVE
Chlamydia: NEGATIVE
Comment: NEGATIVE
Comment: NEGATIVE
Comment: NEGATIVE
Comment: NEGATIVE
Comment: NEGATIVE
Comment: NORMAL
Neisseria Gonorrhea: NEGATIVE
Trichomonas: NEGATIVE

## 2021-06-09 LAB — HIV ANTIBODY (ROUTINE TESTING W REFLEX): HIV 1&2 Ab, 4th Generation: NONREACTIVE

## 2021-06-09 LAB — HEPATITIS C ANTIBODY
Hepatitis C Ab: NONREACTIVE
SIGNAL TO CUT-OFF: <0.02 (ref <1.00)

## 2021-06-13 LAB — HERPES SIMPLEX VIRUS CULTURE
MICRO NUMBER:: 13079652
SPECIMEN QUALITY:: ADEQUATE

## 2021-06-14 ENCOUNTER — Telehealth: Payer: Self-pay | Admitting: Emergency Medicine

## 2021-06-14 NOTE — Telephone Encounter (Signed)
Call back from [facility] with some more questions about lab results. She had spoken to [facility] earlier today about the results. Patient to follow up w/ her PCP regarding any other blood work or question regarding valtrex for a future outbreak. No other questions at this time.  ?

## 2021-06-29 ENCOUNTER — Other Ambulatory Visit (HOSPITAL_COMMUNITY): Payer: Self-pay

## 2021-06-29 MED ORDER — NORETHINDRONE 0.35 MG PO TABS
1.0000 | ORAL_TABLET | Freq: Every day | ORAL | 3 refills | Status: DC
  Filled 2021-06-29: qty 84, 84d supply, fill #0
  Filled 2021-09-05: qty 84, 84d supply, fill #1

## 2021-08-10 ENCOUNTER — Other Ambulatory Visit (HOSPITAL_COMMUNITY): Payer: Self-pay

## 2021-08-10 ENCOUNTER — Other Ambulatory Visit: Payer: Self-pay | Admitting: Neurology

## 2021-08-10 MED ORDER — ONDANSETRON 4 MG PO TBDP
4.0000 mg | ORAL_TABLET | Freq: Three times a day (TID) | ORAL | 2 refills | Status: DC | PRN
  Filled 2021-08-10: qty 20, 7d supply, fill #0
  Filled 2022-03-07: qty 20, 7d supply, fill #1

## 2021-08-24 IMAGING — CT CT HEAD WITHOUT CONTRAST
4 series · 17 of 47 positions shown, 19 images · non-contrast
Comparison: None.

CLINICAL DATA: Worst headache of life

EXAM:
CT HEAD WITHOUT CONTRAST
TECHNIQUE: Contiguous axial images were obtained from the base of the skull
through the vertex without intravenous contrast.

[Series 3: head wo · axial · 0.43mm/px · z∈[-87,+38]mm · 7 of 35 slices shown, 9 images]
[im 5/35  brain]
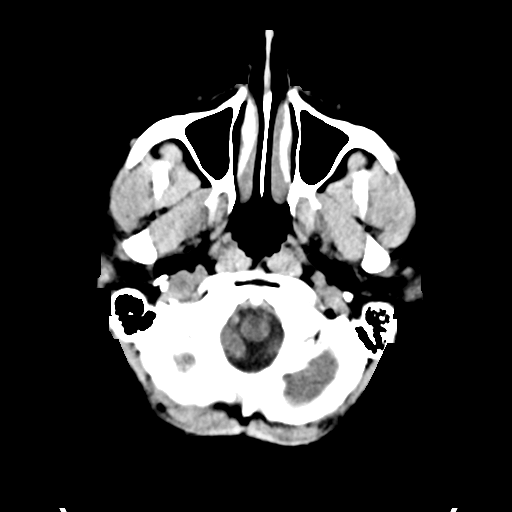
[im 5/35  bone]
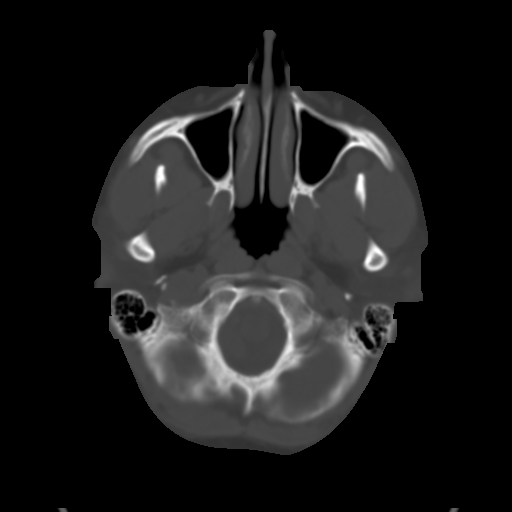
[im 9/35  brain]
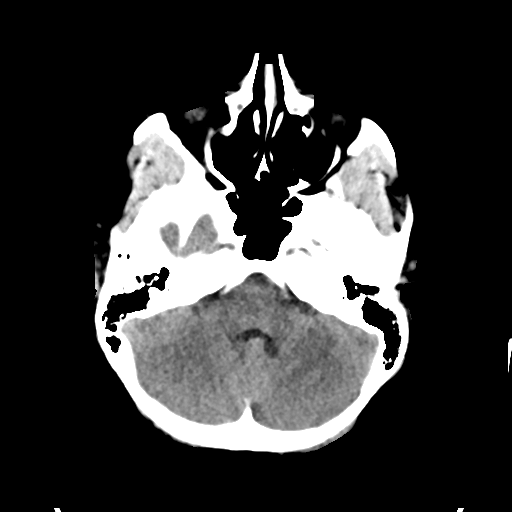
[im 13/35  brain]
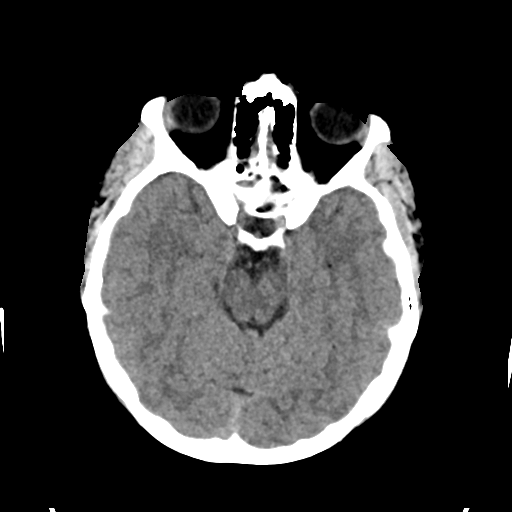
[im 18/35  brain]
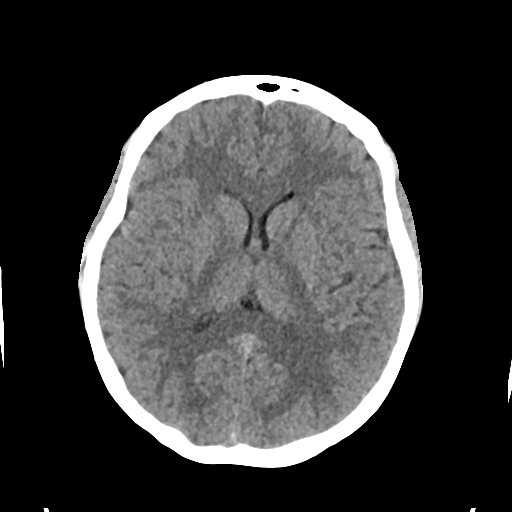
[im 22/35  brain]
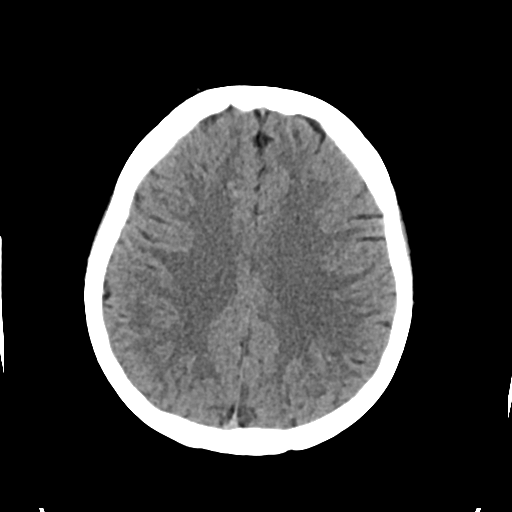
[im 22/35  bone]
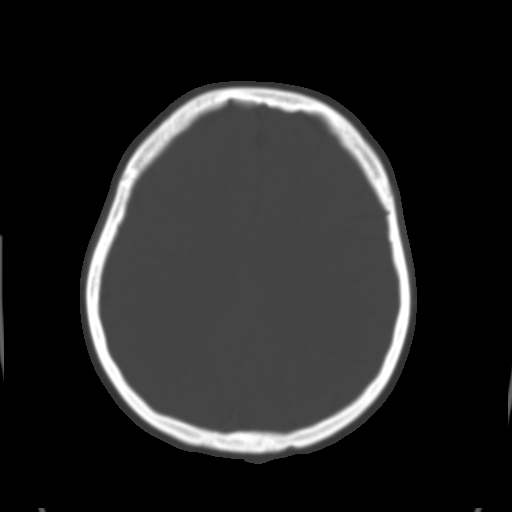
[im 26/35  brain]
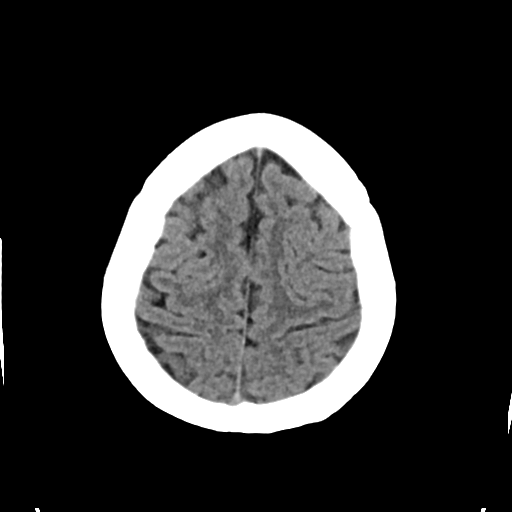
[im 30/35  brain]
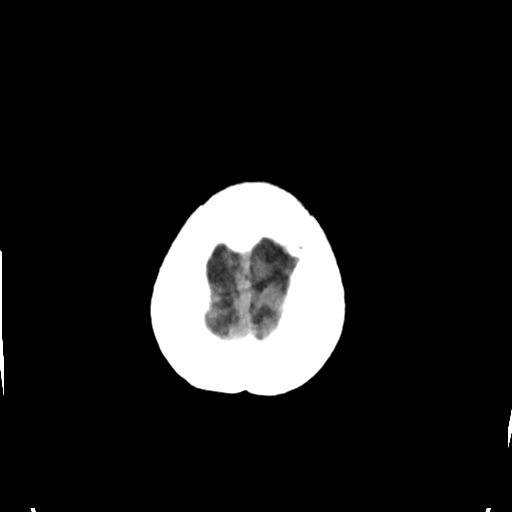

[Series 4: head bone · axial · 0.43mm/px · z∈[-91,-31]mm · 4 of 87 slices shown]
[im 9/87  bone]
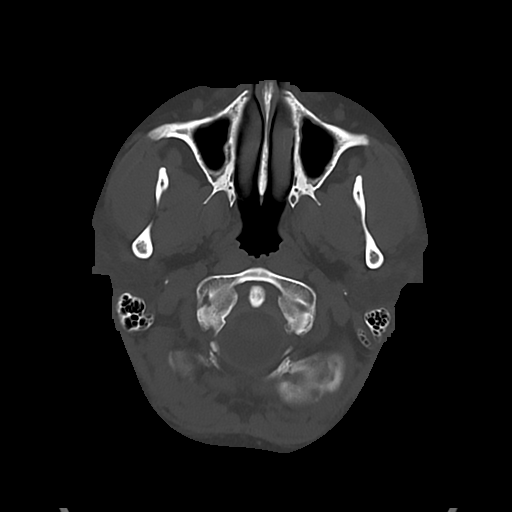
[im 18/87  bone]
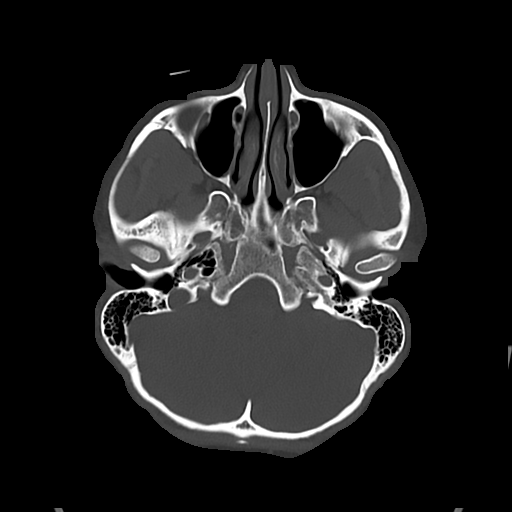
[im 26/87  bone]
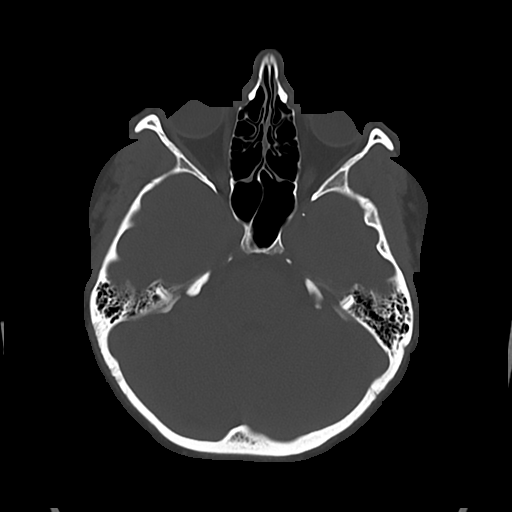
[im 39/87  bone]
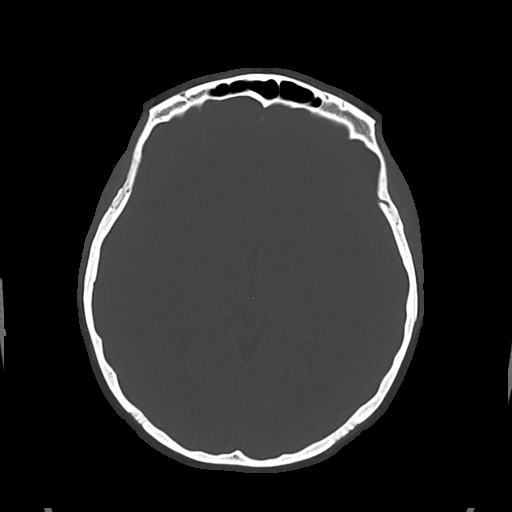

[Series 5: cor soft · coronal · 0.40mm/px · 3 of 67 slices shown]
[im 23/67  brain]
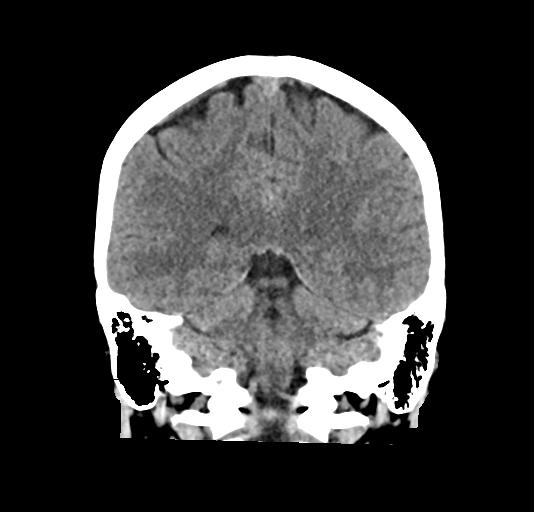
[im 30/67  brain]
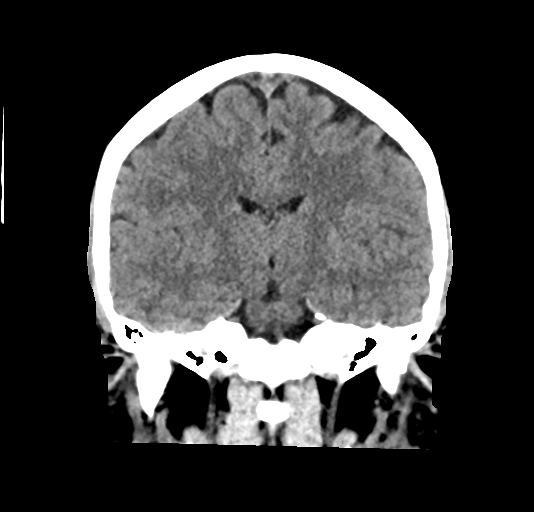
[im 37/67  brain]
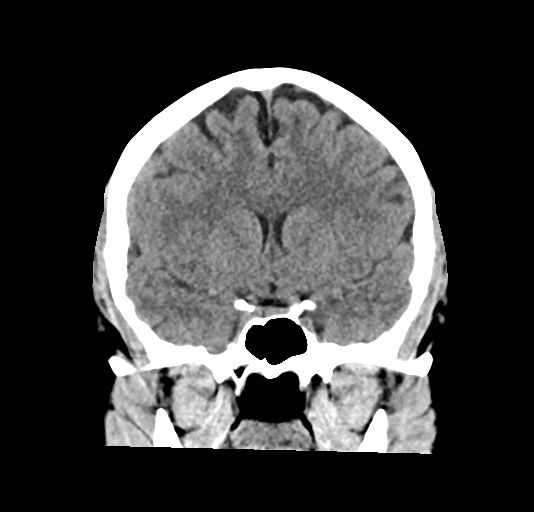

[Series 6: sag soft · sagittal · 0.42mm/px · 3 of 60 slices shown]
[im 20/60  brain]
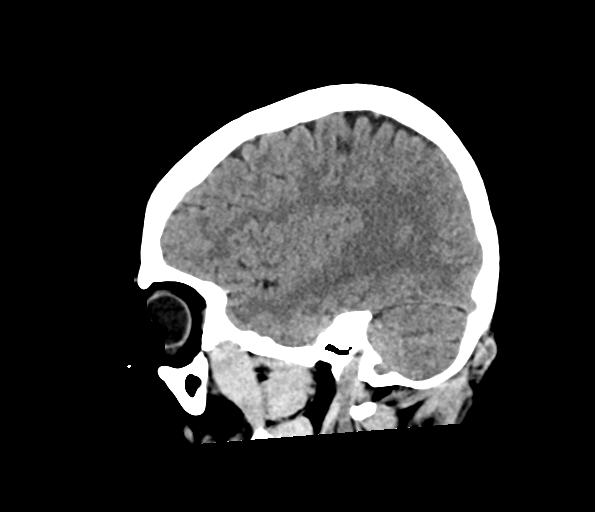
[im 30/60  brain]
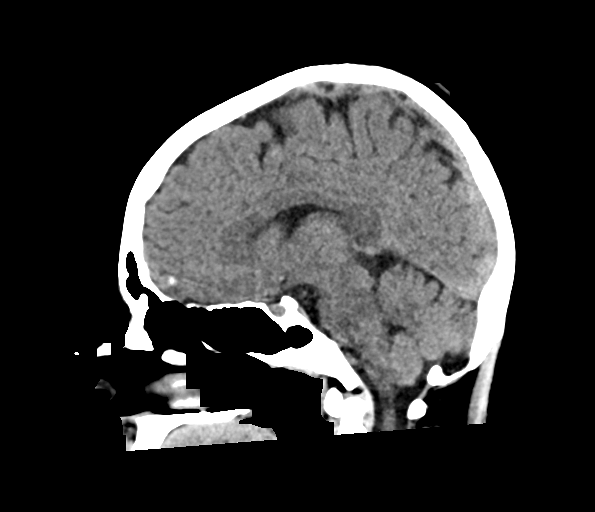
[im 40/60  brain]
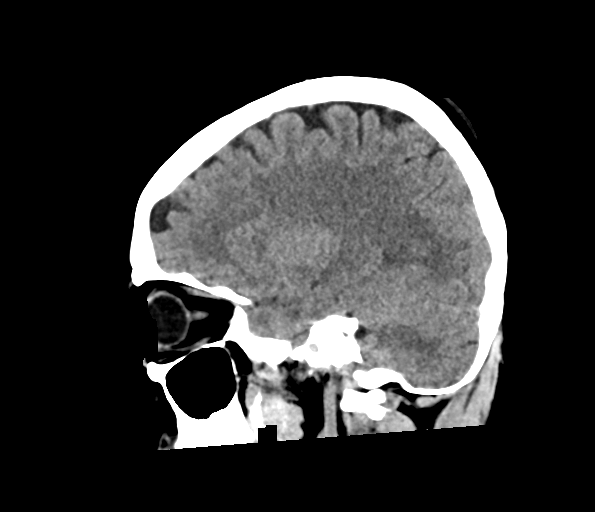

[17 of 47 positions shown; findings below may reference images not displayed]

FINDINGS: Brain: No evidence of acute infarction, hemorrhage, hydrocephalus,
extra-axial collection or mass lesion/mass effect.

Vascular: Negative for hyperdense vessel

Skull: Negative

Sinuses/Orbits: Negative

Other: None
IMPRESSION: Normal CT head

## 2021-08-24 IMAGING — CT CT ANGIOGRAPHY HEAD
2 of 7 series · 17 of 46 positions shown, 19 images · IV contrast (APPLIED)
Comparison: CT head 12/02/2018

CLINICAL DATA: Severe headache. Rule out subarachnoid hemorrhage or
aneurysm

EXAM:
CT ANGIOGRAPHY HEAD
TECHNIQUE: Multidetector CT imaging of the head was performed using the
standard protocol during bolus administration of intravenous
contrast. Multiplanar CT image reconstructions and MIPs were
obtained to evaluate the vascular anatomy.
CONTRAST:  75mL OMNIPAQUE IOHEXOL 350 MG/ML SOLN

[Series 6: thin axial · axial · 0.41mm/px · z∈[-150,+6]mm · 14 of 176 slices shown, 16 images]
[im 10/176  soft-tissue]
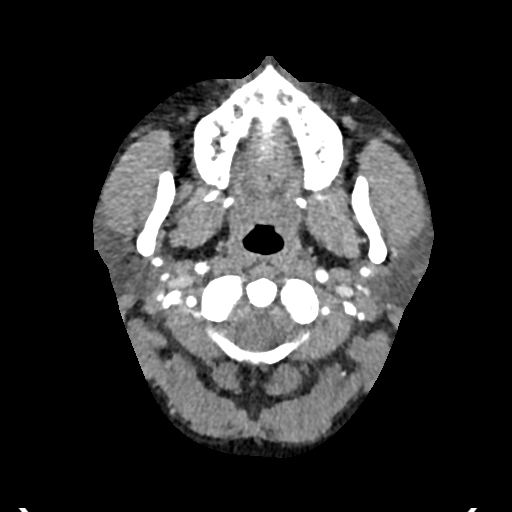
[im 10/176  bone]
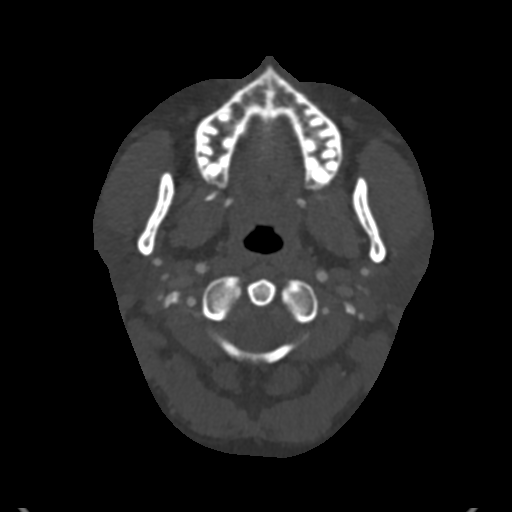
[im 20/176  soft-tissue]
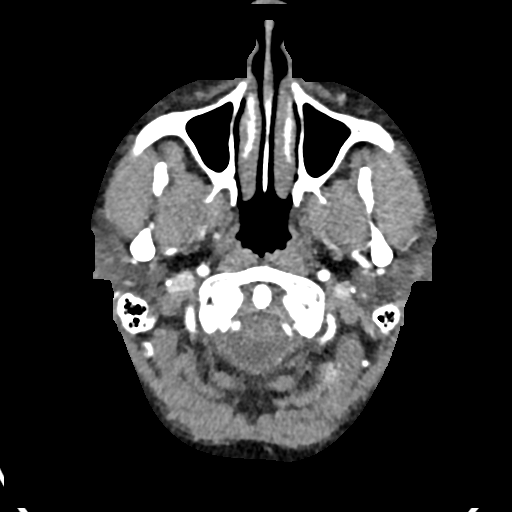
[im 39/176  soft-tissue]
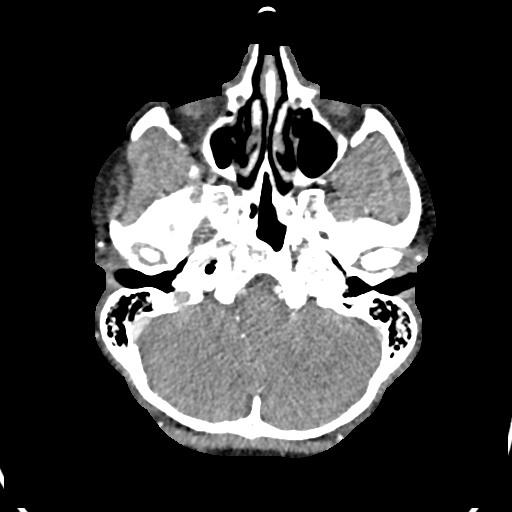
[im 49/176  soft-tissue]
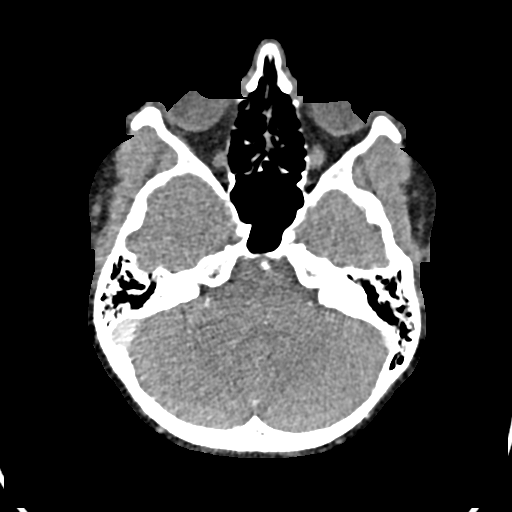
[im 59/176  soft-tissue]
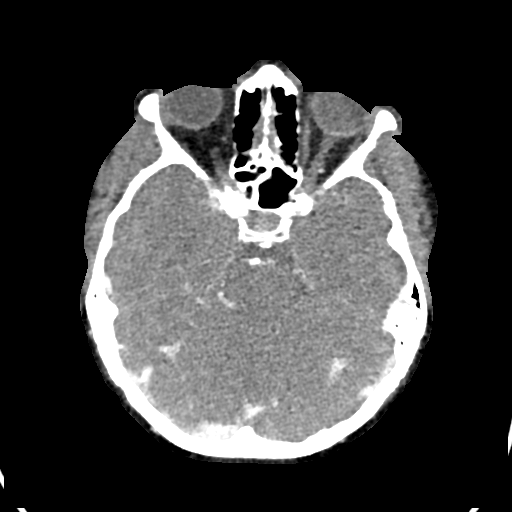
[im 69/176  soft-tissue]
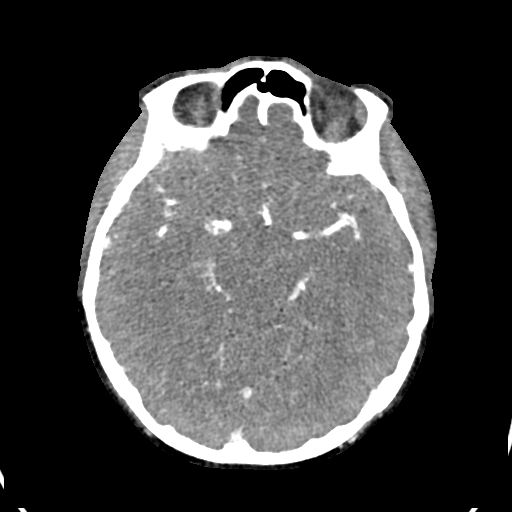
[im 78/176  soft-tissue]
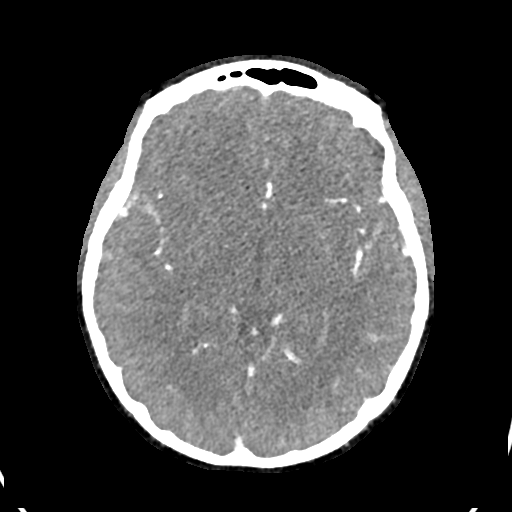
[im 98/176  soft-tissue]
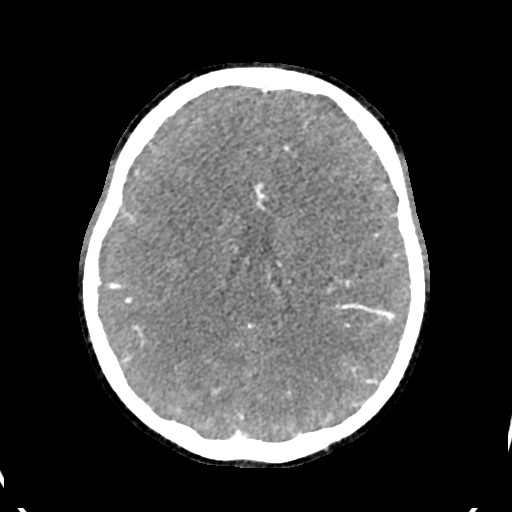
[im 107/176  soft-tissue]
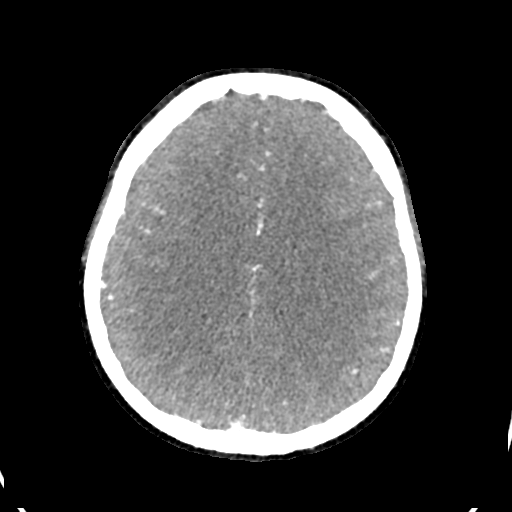
[im 107/176  bone]
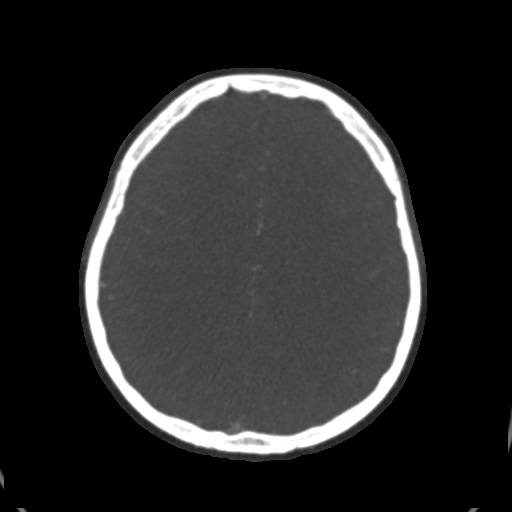
[im 117/176  soft-tissue]
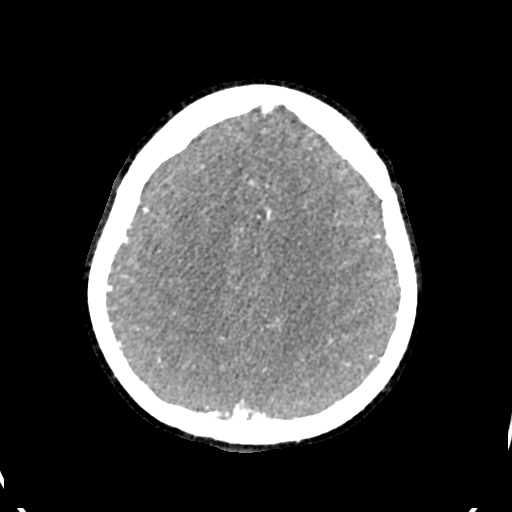
[im 127/176  soft-tissue]
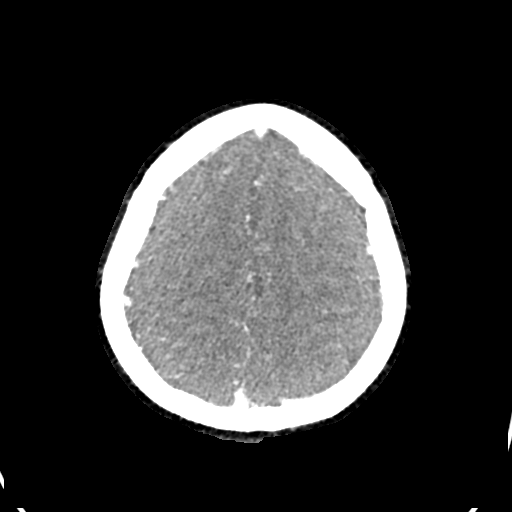
[im 137/176  soft-tissue]
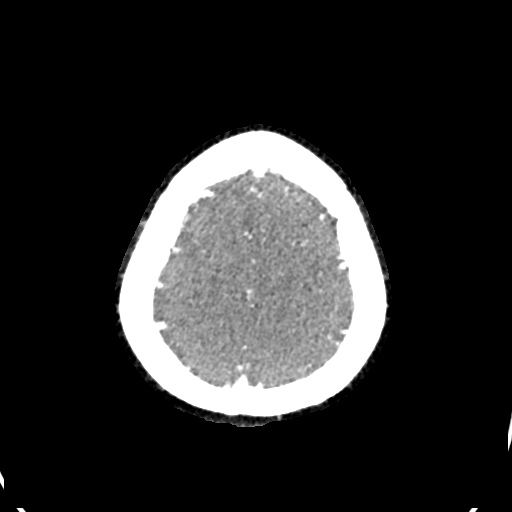
[im 156/176  soft-tissue]
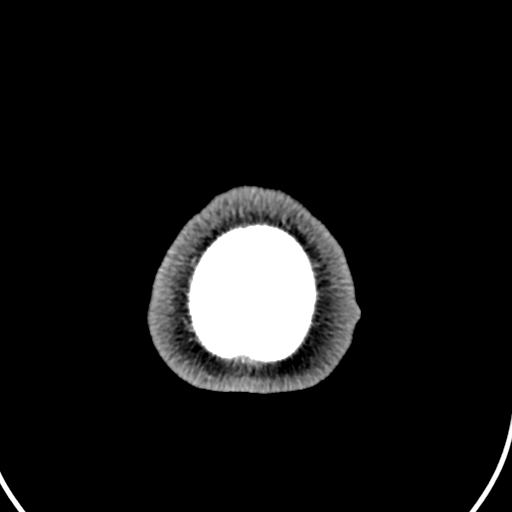
[im 166/176  soft-tissue]
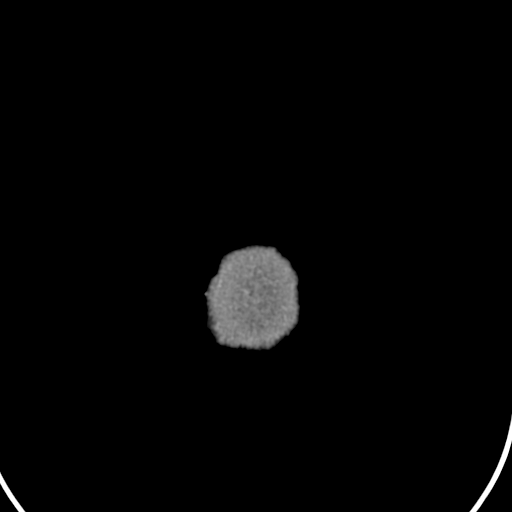

[Series 7: coronal thin · coronal · 0.34mm/px · 3 of 193 slices shown]
[im 55/193  soft-tissue]
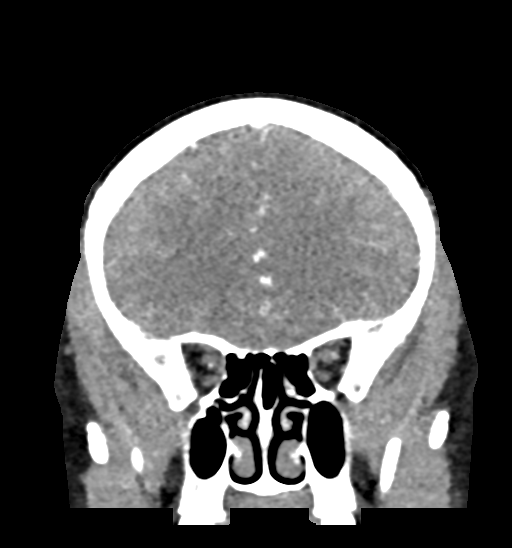
[im 83/193  soft-tissue]
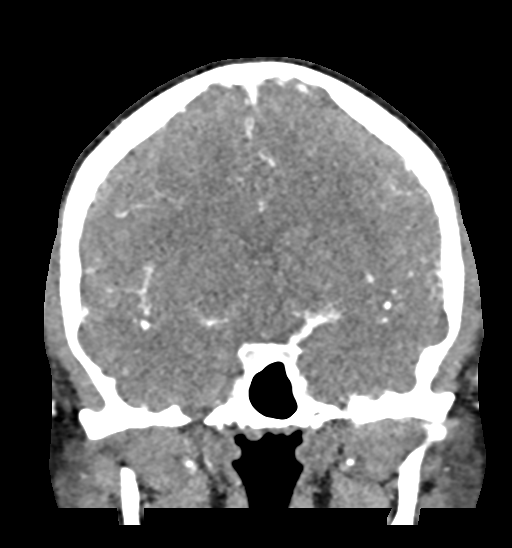
[im 110/193  soft-tissue]
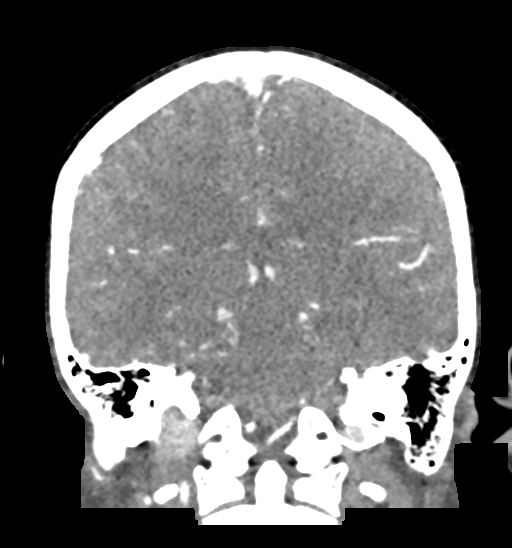

[17 of 46 positions shown; findings below may reference images not displayed]

FINDINGS: CTA HEAD

Anterior circulation: Cavernous carotid widely patent bilaterally.
Negative for atherosclerotic disease or aneurysm. Anterior and
middle cerebral arteries widely patent without stenosis.

Posterior circulation: Both vertebral arteries widely patent to the
basilar. Small PICA bilaterally. Basilar widely patent. AICA,
superior cerebellar, posterior cerebral arteries patent bilaterally.
Fetal origin of the posterior cerebral artery bilaterally with
hypoplastic P1 segments bilaterally. Negative for aneurysm.

Venous sinuses: Normal venous enhancement.

Anatomic variants: None
IMPRESSION: Normal CTA head. Negative for aneurysm, stenosis or large vessel
occlusion.

## 2021-08-30 ENCOUNTER — Other Ambulatory Visit (HOSPITAL_COMMUNITY): Payer: Self-pay

## 2021-08-30 DIAGNOSIS — Z01419 Encounter for gynecological examination (general) (routine) without abnormal findings: Secondary | ICD-10-CM | POA: Diagnosis not present

## 2021-08-30 DIAGNOSIS — Z309 Encounter for contraceptive management, unspecified: Secondary | ICD-10-CM | POA: Diagnosis not present

## 2021-08-30 MED ORDER — NORETHINDRONE 0.35 MG PO TABS
1.0000 | ORAL_TABLET | Freq: Every day | ORAL | 4 refills | Status: AC
  Filled 2021-08-30 – 2021-12-08 (×2): qty 84, 84d supply, fill #0
  Filled 2022-03-07: qty 84, 84d supply, fill #1
  Filled 2022-05-31: qty 84, 84d supply, fill #2
  Filled 2022-08-23: qty 84, 84d supply, fill #3

## 2021-09-05 ENCOUNTER — Other Ambulatory Visit (HOSPITAL_COMMUNITY): Payer: Self-pay

## 2021-09-06 ENCOUNTER — Other Ambulatory Visit (HOSPITAL_COMMUNITY): Payer: Self-pay

## 2021-09-07 ENCOUNTER — Other Ambulatory Visit (HOSPITAL_COMMUNITY): Payer: Self-pay

## 2021-09-07 MED ORDER — ESCITALOPRAM OXALATE 20 MG PO TABS
20.0000 mg | ORAL_TABLET | Freq: Every day | ORAL | 0 refills | Status: DC
  Filled 2021-09-07: qty 90, 90d supply, fill #0

## 2021-10-18 DIAGNOSIS — F321 Major depressive disorder, single episode, moderate: Secondary | ICD-10-CM | POA: Diagnosis not present

## 2021-10-18 DIAGNOSIS — E042 Nontoxic multinodular goiter: Secondary | ICD-10-CM | POA: Diagnosis not present

## 2021-10-18 DIAGNOSIS — Z23 Encounter for immunization: Secondary | ICD-10-CM | POA: Diagnosis not present

## 2021-10-18 DIAGNOSIS — Z0000 Encounter for general adult medical examination without abnormal findings: Secondary | ICD-10-CM | POA: Diagnosis not present

## 2021-10-18 DIAGNOSIS — G43709 Chronic migraine without aura, not intractable, without status migrainosus: Secondary | ICD-10-CM | POA: Diagnosis not present

## 2021-10-18 DIAGNOSIS — E221 Hyperprolactinemia: Secondary | ICD-10-CM | POA: Diagnosis not present

## 2021-10-18 DIAGNOSIS — Z13220 Encounter for screening for lipoid disorders: Secondary | ICD-10-CM | POA: Diagnosis not present

## 2021-11-07 ENCOUNTER — Telehealth: Payer: Self-pay | Admitting: *Deleted

## 2021-11-07 NOTE — Telephone Encounter (Signed)
Submitted PA Nurtec on CMM. Key: [id number]. Waiting on determination from [employer].

## 2021-11-07 NOTE — Telephone Encounter (Signed)
"  The request has been approved. The authorization is effective for a maximum of 12 fills from 11/07/2021 to 11/07/2022, as long as the member is enrolled in their current health plan. The request was approved with a quantity restriction. This has been approved for a max daily dosage of 0.6. A written notification letter will follow with additional details."

## 2021-11-23 ENCOUNTER — Other Ambulatory Visit (HOSPITAL_COMMUNITY): Payer: Self-pay

## 2021-11-23 MED ORDER — PHENTERMINE HCL 37.5 MG PO TABS
37.5000 mg | ORAL_TABLET | Freq: Every day | ORAL | 0 refills | Status: DC
  Filled 2021-11-23: qty 30, 30d supply, fill #0

## 2021-11-28 NOTE — Patient Instructions (Signed)
Below is our plan:  We will continue propanolol 30mg  daily and Nurtec as needed.   Please make sure you are staying well hydrated. I recommend 50-60 ounces daily. Well balanced diet and regular exercise encouraged. Consistent sleep schedule with 6-8 hours recommended.   Please continue follow up with care team as directed.   Follow up with me in 1 year   You may receive a survey regarding today's visit. I encourage you to leave honest feed back as I do use this information to improve patient care. Thank you for seeing me today!

## 2021-11-28 NOTE — Progress Notes (Unsigned)
No chief complaint on file.   HISTORY OF PRESENT ILLNESS:  11/28/21 ALL:  Natasha Giles returns for follow up for migraines. She continues propranolol 30mg  daily and Nurtec as needed.   11/28/2020 ALL: Natasha Giles returns for follow up for migraines. She continues propranolol 30mg  at bedtime and Nurtec as needed. She is doing very well. May have 5 headache days per month, 2 are migrainous. Nurtec works great for abortive therapy. She has tolerated propranolol well. BP is normal.   05/31/2020 ALL:  She returns for migraine follow up. We started propranolol 10mg  BID and sumatriptan PRN in 01/2020. She feels that headaches are much better after starting propranolol 10mg  BID. She has tolerated it well. No concerns of breathing difficulty or asthma flares. She was having near daily headaches and now having 2-3 per month. 1-2 are migrainous. Sumatriptan works most of the time. She is not checking her BP routinely. She does report that with migraine BP has been high. Last documented reading was 208/104. No red flag warnings. BP returned to normal with migraine abortion. She scheduled a CPE with PCP in May. No syncopal events.    01/21/2020 ALL: Natasha Giles is a 26 y.o. female here today for follow up for migraines. She feels that she was ing well for a period of time. She reports migraines have increased in frequency over the past month. She has about 4-8 migraines per month. She denies any aura symptoms. Intermittent She was using rizatriptan for abortive therapy. She does not feel that it helped that much so she hasn't taken it recently. She usually uses a heated rice bag that helps. She reports a remote history of exercise induced asthma controlled with as needed Albuterol, last used at age 25 and was told "she grew out of it".  She has never used Albuterol as an adult. She denies respiratory symptoms.   She was recently evaluated by the ER following sudden onset of dizziness and generalized weakness.  Coworkers reported that she was awake but not able to respond to them. Similar events have occurred in the past and some reports of slurred speech. She feels shaky and lightheaded. She reports an episode of syncope about two weeks ago at work but did not seek medical attention. MRI was normal 01/2019. EEG 01/2020 was normal. BP was elevated, highest was 160/98. 130/82 today. She has never had issues with BP in the past. She reports dizziness and hot flashes and is concerned she is hyperthyroid. TSH, T4 normal in ER. She is followed by endocrinology and PCP. She saw endocrinology in July and had a normal check up. She has not seen PCP recently. She is going through a divorce. She does not have a great relationship with her parents. She is switching jobs to work M-F. 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 She does not sleep well. She is taking melatonin QHS.    HISTORY (copied from Dr 02/2019 note on 01/27/2019)  HPI:  Natasha Giles is a 26 y.o. female here as requested by ER for Migraines.  Past medical history depression, thyroid disease, headaches.  Patient was seen in the emergency room at the end of August, she is an 01/29/2019 and just after 10 AM she had a sudden onset sharp in quality headache severe worst of life on the right side of her head extending all the way to the occiput which lasted 45 minutes.  Quick onset, she had headaches in the past never that  severe and different quality normally in the center of her forehead and duller.  Imaging was negative.  Headache had improved when she was seen. 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 They were not sure whether her pituitary gland was enlarged. The headaches went away for a few years. Within the last 6 months she has had worsening headaches. Most recently she was at work, and  all of a sudden something :exploded" in her head just on the right side, in the past she would lose her vision with headaches and this time she had blurry vision but no inciting event and not even a slight headache prior to the headache, holding her head down made it feel better, she describes a sharp throbbing pain, it lasted that evening, nothing helped she still had the headache back home, t went away after sleeping. Saturday they were out of town and she started getting a migraine, it usually starts behind the eyes, +photo/phonophobia and pounding, sleeping helped and she had nausea, movement made it worse, she felt weak. She loses her vision, it goes "black" for 2 minutes and her speech pattern is impaired. Husband provides much information as well. Headaches are severe.  She has 8 days of headaches a month, 2-3 days of migraines which can be severe. She has not found anything that works for the migraines. Alleve or tylenol. Her cousin has migraines. She was having episodes during sleep, she has twitching in her legs, the last week she was twitching, her arms will move, one night she had her arms up above her like she was conducting, another night jekling her limbs, body tightening up and breathing changing.He sometimes has to hold her to keep her from hitting herself.    Reviewed notes, labs and imaging from outside physicians, which showed:   CT head 12/02/2018 showed No acute intracranial abnormalities including mass lesion or mass effect, hydrocephalus, extra-axial fluid collection, midline shift, hemorrhage, or acute infarction, large ischemic events (personally reviewed images)   CTA: reviewed report: Normal CTA head. Negative for aneurysm, stenosis or large vessel occlusion.   BMP nml, CBC with WBCs 11.5 and plts 409 otherwise normal, neg pregnancy  REVIEW OF SYSTEMS: Out of a complete 14 system review of symptoms, the patient complains only of the following symptoms, migraines, dizziness,  anxiety, and all other reviewed systems are negative.   ALLERGIES: Allergies  Allergen Reactions   Penicillins      HOME MEDICATIONS: Outpatient Medications Prior to Visit  Medication Sig Dispense Refill   cabergoline (DOSTINEX) 0.5 MG tablet Take one half tablet (0.25 mg dose) by mouth once a week. 8 tablet 1   escitalopram (LEXAPRO) 20 MG tablet Take 1 tablet (20 mg total) by mouth daily. 90 tablet 0   fluconazole (DIFLUCAN) 150 MG tablet Take 1 tablet (150 mg total) by mouth once for 1 dose. May repeat in 72 hours if needed 2 tablet 0   ibuprofen (ADVIL) 600 MG tablet Take 1 tablet (600 mg total) by mouth every 6 (six) hours as needed. 20 tablet 0   norethindrone (CAMILA) 0.35 MG tablet Take 1 tablet (0.35 mg total) by mouth daily. 84 tablet 3   norethindrone (CAMILA) 0.35 MG tablet Take 1 tablet (0.35 mg total) by mouth daily. 84 tablet 4   norethindrone (MICRONOR) 0.35 MG tablet TAKE 1  TABLET BY MOUTH ONCE DAILY 84 tablet 3   ondansetron (ZOFRAN-ODT) 4 MG disintegrating tablet Dissolve 1 tablet (4 mg total) by mouth every 8 (eight) hours as needed for nausea. 20 tablet 2   phentermine (ADIPEX-P) 37.5 MG tablet Take 1 tablet (37.5 mg total) by mouth daily. 30 tablet 0   propranolol (INDERAL) 10 MG tablet Take 1 tablet (10 mg total) by mouth 3 (three) times daily. 270 tablet 3   Rimegepant Sulfate 75 MG TBDP DISSOLVE 1 TABLET BY MOUTH ONCE DAILY AS NEEDED FOR ABORTIVE THERAPY OF MIGRAINE. NO MORE THAN 1 TABLET IN 24 HOURS OR 10 TABLETS PER MONTH. 8 tablet 11   No facility-administered medications prior to visit.     PAST MEDICAL HISTORY: Past Medical History:  Diagnosis Date   Anxiety    Asthma    Depression    Gastritis and duodenitis    Migraine    Multiple thyroid nodules    Thyroid disease      PAST SURGICAL HISTORY: Past Surgical History:  Procedure Laterality Date   NO PAST SURGERIES     TONSILLECTOMY       FAMILY HISTORY: Family History  Problem Relation  Age of Onset   Healthy Mother    Hypertension Father    Diabetes Maternal Grandmother    Diabetes Maternal Grandfather    Diabetes Paternal Grandmother    Renal Disease Paternal Grandmother        ESRD   Diabetes Paternal Grandfather    Migraines Cousin      SOCIAL HISTORY: Social History   Socioeconomic History   Marital status: Married    Spouse name: Not on file   Number of children: 0   Years of education: Not on file   Highest education level: Bachelor's degree (e.g., BA, AB, BS)  Occupational History   Not on file  Tobacco Use   Smoking status: Never   Smokeless tobacco: Never  Vaping Use   Vaping Use: Never used  Substance and Sexual Activity   Alcohol use: Yes    Comment: 1-2 per month, occasional   Drug use: Never   Sexual activity: Not on file  Other Topics Concern   Not on file  Social History Narrative   Lives at home with husband, Molli Hazard   Right handed   Caffeine: 1 cup coffee/day   Social Determinants of Health   Financial Resource Strain: Not on file  Food Insecurity: Not on file  Transportation Needs: Not on file  Physical Activity: Not on file  Stress: Not on file  Social Connections: Not on file  Intimate Partner Violence: Not on file      PHYSICAL EXAM  There were no vitals filed for this visit.   There is no height or weight on file to calculate BMI.   Generalized: Well developed, in no acute distress   Neurological examination  Mentation: Alert oriented to time, place, history taking. Follows all commands speech and language fluent Cranial nerve II-XII: Pupils were equal round reactive to light. Extraocular movements were full, visual field were full on confrontational test. Facial sensation and strength were normal. Head turning and shoulder shrug  were normal and symmetric. Motor: The motor testing reveals 5 over 5 strength of all 4 extremities. Good symmetric motor tone is noted throughout.  Gait and station: Gait is  normal.    DIAGNOSTIC DATA (LABS, IMAGING, TESTING) - I reviewed patient records, labs, notes, testing and imaging myself where available.  Lab Results  Component Value Date   WBC 10.7 (H) 01/18/2020   HGB 12.3 01/18/2020   HCT 37.8 01/18/2020   MCV 90.9 01/18/2020   PLT 424 (H) 01/18/2020      Component Value Date/Time   NA 137 01/18/2020 1448   K 3.6 01/18/2020 1448   CL 103 01/18/2020 1448   CO2 24 01/18/2020 1448   GLUCOSE 98 01/18/2020 1448   BUN 8 01/18/2020 1448   CREATININE 0.79 01/18/2020 1448   CALCIUM 9.1 01/18/2020 1448   GFRNONAA >60 01/18/2020 1448   GFRAA >60 12/02/2018 1142   No results found for: "CHOL", "HDL", "LDLCALC", "LDLDIRECT", "TRIG", "CHOLHDL" No results found for: "HGBA1C" No results found for: "VITAMINB12" Lab Results  Component Value Date   TSH 0.979 01/18/2020      ASSESSMENT AND PLAN  26 y.o. year old female  has a past medical history of Anxiety, Asthma, Depression, Gastritis and duodenitis, Migraine, Multiple thyroid nodules, and Thyroid disease. here with   No diagnosis found.  Khira reports headaches have significantly improved. We will continue propranolol 30mg  daily and Nurtec for abortive therapy. Healthy lifestyle habits encouraged. Low sodium diet advised. She will follow up in 1 year, sooner if needed.    , MSN, FNP-C 11/28/2021, 9:55 AM  [facility] [address] [place], [place] [phone]

## 2021-11-29 ENCOUNTER — Other Ambulatory Visit (HOSPITAL_COMMUNITY): Payer: Self-pay

## 2021-11-29 ENCOUNTER — Ambulatory Visit: Payer: 59 | Admitting: Family Medicine

## 2021-11-29 ENCOUNTER — Encounter: Payer: Self-pay | Admitting: Family Medicine

## 2021-11-29 VITALS — BP 127/84 | HR 83 | Ht 66.0 in | Wt 196.0 lb

## 2021-11-29 DIAGNOSIS — G43009 Migraine without aura, not intractable, without status migrainosus: Secondary | ICD-10-CM

## 2021-11-29 MED ORDER — RIMEGEPANT SULFATE 75 MG PO TBDP
ORAL_TABLET | ORAL | 11 refills | Status: AC
  Filled 2021-11-29: qty 8, 30d supply, fill #0

## 2021-12-08 ENCOUNTER — Other Ambulatory Visit (HOSPITAL_COMMUNITY): Payer: Self-pay

## 2021-12-29 DIAGNOSIS — E069 Thyroiditis, unspecified: Secondary | ICD-10-CM | POA: Diagnosis not present

## 2021-12-29 DIAGNOSIS — E221 Hyperprolactinemia: Secondary | ICD-10-CM | POA: Diagnosis not present

## 2021-12-29 DIAGNOSIS — E042 Nontoxic multinodular goiter: Secondary | ICD-10-CM | POA: Diagnosis not present

## 2022-01-04 ENCOUNTER — Other Ambulatory Visit (HOSPITAL_COMMUNITY): Payer: Self-pay

## 2022-01-04 MED ORDER — PHENTERMINE HCL 37.5 MG PO TABS
18.7500 mg | ORAL_TABLET | Freq: Every day | ORAL | 0 refills | Status: DC
  Filled 2022-01-04: qty 15, 30d supply, fill #0

## 2022-01-05 ENCOUNTER — Other Ambulatory Visit (HOSPITAL_COMMUNITY): Payer: Self-pay

## 2022-02-01 DIAGNOSIS — E221 Hyperprolactinemia: Secondary | ICD-10-CM | POA: Diagnosis not present

## 2022-02-01 DIAGNOSIS — E042 Nontoxic multinodular goiter: Secondary | ICD-10-CM | POA: Diagnosis not present

## 2022-02-08 ENCOUNTER — Other Ambulatory Visit (HOSPITAL_COMMUNITY): Payer: Self-pay

## 2022-02-08 MED ORDER — PHENTERMINE HCL 37.5 MG PO TABS
37.5000 mg | ORAL_TABLET | Freq: Every day | ORAL | 0 refills | Status: DC
  Filled 2022-02-08: qty 30, 30d supply, fill #0

## 2022-03-07 ENCOUNTER — Other Ambulatory Visit (HOSPITAL_COMMUNITY): Payer: Self-pay

## 2022-03-08 DIAGNOSIS — N939 Abnormal uterine and vaginal bleeding, unspecified: Secondary | ICD-10-CM | POA: Diagnosis not present

## 2022-03-09 ENCOUNTER — Other Ambulatory Visit (HOSPITAL_COMMUNITY): Payer: Self-pay

## 2022-03-09 DIAGNOSIS — J0110 Acute frontal sinusitis, unspecified: Secondary | ICD-10-CM | POA: Diagnosis not present

## 2022-03-09 DIAGNOSIS — T3695XA Adverse effect of unspecified systemic antibiotic, initial encounter: Secondary | ICD-10-CM | POA: Diagnosis not present

## 2022-03-09 DIAGNOSIS — B379 Candidiasis, unspecified: Secondary | ICD-10-CM | POA: Diagnosis not present

## 2022-03-09 DIAGNOSIS — R062 Wheezing: Secondary | ICD-10-CM | POA: Diagnosis not present

## 2022-03-09 DIAGNOSIS — R051 Acute cough: Secondary | ICD-10-CM | POA: Diagnosis not present

## 2022-03-09 MED ORDER — ALBUTEROL SULFATE HFA 108 (90 BASE) MCG/ACT IN AERS
2.0000 | INHALATION_SPRAY | Freq: Four times a day (QID) | RESPIRATORY_TRACT | 0 refills | Status: AC | PRN
  Filled 2022-03-09: qty 6.7, 25d supply, fill #0

## 2022-03-09 MED ORDER — FLUCONAZOLE 150 MG PO TABS
150.0000 mg | ORAL_TABLET | ORAL | 0 refills | Status: DC
  Filled 2022-03-09: qty 2, 2d supply, fill #0

## 2022-03-09 MED ORDER — PREDNISONE 20 MG PO TABS
40.0000 mg | ORAL_TABLET | Freq: Every day | ORAL | 0 refills | Status: DC
  Filled 2022-03-09: qty 10, 5d supply, fill #0

## 2022-03-09 MED ORDER — AZITHROMYCIN 250 MG PO TABS
ORAL_TABLET | ORAL | 0 refills | Status: AC
  Filled 2022-03-09: qty 6, 5d supply, fill #0

## 2022-05-02 ENCOUNTER — Other Ambulatory Visit (HOSPITAL_COMMUNITY): Payer: Self-pay

## 2022-05-02 MED ORDER — PHENTERMINE HCL 37.5 MG PO TABS
37.5000 mg | ORAL_TABLET | Freq: Every day | ORAL | 0 refills | Status: AC
  Filled 2022-05-02: qty 30, 30d supply, fill #0

## 2022-05-03 ENCOUNTER — Other Ambulatory Visit (HOSPITAL_COMMUNITY): Payer: Self-pay

## 2022-05-04 ENCOUNTER — Other Ambulatory Visit (HOSPITAL_COMMUNITY): Payer: Self-pay

## 2022-05-07 ENCOUNTER — Encounter: Payer: Self-pay | Admitting: Emergency Medicine

## 2022-05-07 ENCOUNTER — Ambulatory Visit
Admission: EM | Admit: 2022-05-07 | Discharge: 2022-05-07 | Disposition: A | Discharge status: Home / Self Care | Payer: Commercial Managed Care - PPO | Attending: Family Medicine | Admitting: Family Medicine

## 2022-05-07 DIAGNOSIS — L0501 Pilonidal cyst with abscess: Secondary | ICD-10-CM

## 2022-05-07 MED ORDER — FLUCONAZOLE 150 MG PO TABS: 150.0000 mg | ORAL_TABLET | ORAL | 0 refills | Status: AC

## 2022-05-07 MED ORDER — AMOXICILLIN-POT CLAVULANATE 875-125 MG PO TABS: 1.0000 | ORAL_TABLET | Freq: Two times a day (BID) | ORAL | 0 refills | Status: AC

## 2022-05-07 NOTE — Discharge Instructions (Signed)
Take Augmentin 2 times a day Take Diflucan if needed Warm compresses for 20 minutes every couple of hours Return if needed

## 2022-05-07 NOTE — ED Triage Notes (Signed)
Pain since Wednesday to tail bone area Per pt area looks bruised & is painful Pt had a fall 6 months ago in the shower- no xrays No hx of cysts OTC meds - tylenol - none since 0530 - min relief

## 2022-05-07 NOTE — ED Provider Notes (Signed)
[facility]    CSN: [id number] Arrival date & time: 05/07/22  1555      History   Chief Complaint Chief Complaint  Patient presents with   Tailbone Pain    HPI Natasha Giles is a 27 y.o. female.   HPI  Patient has a painful nodule in the crease between her buttocks.  Present for the last couple of days.  She is taking Tylenol with mild relief.  No drainage.  No fever.  No trauma  Past Medical History:  Diagnosis Date   Anxiety    Asthma    Depression    Gastritis and duodenitis    Migraine    Multiple thyroid nodules    Thyroid disease     Patient Active Problem List   Diagnosis Date Noted   Migraine without aura and without status migrainosus, not intractable 01/27/2019    Past Surgical History:  Procedure Laterality Date   TONSILLECTOMY      OB History   No obstetric history on file.      Home Medications    Prior to Admission medications   Medication Sig Start Date End Date Taking? Authorizing Provider  amoxicillin-clavulanate (AUGMENTIN) 875-125 MG tablet Take 1 tablet by mouth every 12 (twelve) hours. 05/07/22  Yes Raylene Everts, MD  albuterol (VENTOLIN HFA) 108 (90 Base) MCG/ACT inhaler Inhale 2 puffs into the lungs every 6 (six) hours as needed. 03/09/22     fluconazole (DIFLUCAN) 150 MG tablet Take 1 tablet (150 mg total) by mouth once for 1 dose. May repeat in 72 hours 05/07/22   Raylene Everts, MD  norethindrone (CAMILA) 0.35 MG tablet Take 1 tablet (0.35 mg total) by mouth daily. 08/30/21     phentermine (ADIPEX-P) 37.5 MG tablet Take 1 tablet (37.5 mg total) by mouth daily. 05/01/22     Rimegepant Sulfate 75 MG TBDP DISSOLVE 1 TABLET BY MOUTH ONCE DAILY AS NEEDED FOR ABORTIVE THERAPY OF MIGRAINE. NO MORE THAN 1 TABLET IN 24 HOURS OR 10 TABLETS PER MONTH. 11/29/21   Lomax, Amy, NP  amitriptyline (ELAVIL) 25 MG tablet Take 1 tablet (25 mg total) by mouth at bedtime. 02/13/21 11/23/21      Family History Family History  Problem  Relation Age of Onset   Healthy Mother    Hypertension Father    Diabetes Maternal Grandmother    Diabetes Maternal Grandfather    Diabetes Paternal Grandmother    Renal Disease Paternal Grandmother        ESRD   Diabetes Paternal Grandfather    Migraines Cousin     Social History Social History   Tobacco Use   Smoking status: Never   Smokeless tobacco: Never  Vaping Use   Vaping Use: Never used  Substance Use Topics   Alcohol use: Yes    Comment: 1-2 per month, occasional   Drug use: Never     Allergies   Penicillins   Review of Systems Review of Systems See HPI  Physical Exam Triage Vital Signs ED Triage Vitals  Enc Vitals Group     BP 05/07/22 1631 120/82     Pulse Rate 05/07/22 1631 88     Resp 05/07/22 1631 14     Temp 05/07/22 1631 98.9 F (37.2 C)     Temp Source 05/07/22 1631 Oral     SpO2 05/07/22 1631 100 %     Weight 05/07/22 1634 161 lb (73 kg)     Height 05/07/22 1634 5\' 6"  (  1.676 m)     Head Circumference --      Peak Flow --      Pain Score 05/07/22 1634 5     Pain Loc --      Pain Edu? --      Excl. in [place]? --    No data found.  Updated Vital Signs BP 120/82 (BP Location: Left Arm) Comment: HTN hx - no meds  Pulse 88   Temp 98.9 F (37.2 C) (Oral)   Resp 14   Ht 5\' 6"  (1.676 m)   Wt 73 kg   LMP 05/03/2022 (Exact Date)   SpO2 100%   BMI 25.99 kg/m       Physical Exam Constitutional:      General: She is not in acute distress.    Appearance: She is well-developed.  HENT:     Head: Normocephalic and atraumatic.  Eyes:     Conjunctiva/sclera: Conjunctivae normal.     Pupils: Pupils are equal, round, and reactive to light.  Cardiovascular:     Rate and Rhythm: Normal rate.  Pulmonary:     Effort: Pulmonary effort is normal. No respiratory distress.  Abdominal:     General: There is no distension.     Palpations: Abdomen is soft.  Musculoskeletal:        General: Normal range of motion.     Cervical back: Normal range  of motion.       Back:  Skin:    General: Skin is warm and dry.  Neurological:     Mental Status: She is alert.      UC Treatments / Results  Labs (all labs ordered are listed, but only abnormal results are displayed) Labs Reviewed - No data to display  EKG   Radiology No results found.  Procedures Procedures (including critical care time)  Medications Ordered in UC Medications - No data to display  Initial Impression / Assessment and Plan / UC Course  I have reviewed the triage vital signs and the nursing notes.  Pertinent labs & imaging results that were available during my care of the patient were reviewed by me and considered in my medical decision making (see chart for details).     The cyst is indurated but no fluctuance.  I do not think it is ready for any I&D.  Will treat with antibiotics warm compresses to return if needed Final Clinical Impressions(s) / UC Diagnoses   Final diagnoses:  Pilonidal abscess     Discharge Instructions      Take Augmentin 2 times a day Take Diflucan if needed Warm compresses for 20 minutes every couple of hours Return if needed   ED Prescriptions     Medication Sig Dispense Auth. Provider   fluconazole (DIFLUCAN) 150 MG tablet Take 1 tablet (150 mg total) by mouth once for 1 dose. May repeat in 72 hours 2 tablet Raylene Everts, MD   amoxicillin-clavulanate (AUGMENTIN) 875-125 MG tablet Take 1 tablet by mouth every 12 (twelve) hours. 14 tablet Raylene Everts, MD      PDMP not reviewed this encounter.   Raylene Everts, MD 05/07/22 [phone]

## 2022-05-08 ENCOUNTER — Telehealth: Payer: Self-pay

## 2022-05-08 NOTE — Telephone Encounter (Signed)
LMTRC if any questions or concerns. 

## 2022-06-01 ENCOUNTER — Other Ambulatory Visit: Payer: Self-pay

## 2022-08-24 ENCOUNTER — Other Ambulatory Visit (HOSPITAL_COMMUNITY): Payer: Self-pay

## 2022-09-04 DIAGNOSIS — Z01419 Encounter for gynecological examination (general) (routine) without abnormal findings: Secondary | ICD-10-CM | POA: Diagnosis not present

## 2022-09-04 DIAGNOSIS — Z1331 Encounter for screening for depression: Secondary | ICD-10-CM | POA: Diagnosis not present

## 2022-09-05 ENCOUNTER — Other Ambulatory Visit (HOSPITAL_COMMUNITY): Payer: Self-pay

## 2022-10-28 ENCOUNTER — Telehealth: Payer: Self-pay

## 2022-10-28 ENCOUNTER — Other Ambulatory Visit (HOSPITAL_COMMUNITY): Payer: Self-pay

## 2022-10-28 NOTE — Telephone Encounter (Signed)
Pharmacy Patient Advocate Encounter   Received notification from CoverMyMeds that prior authorization for Nurtec 75MG  dispersible tablets is required/requested.   Insurance verification completed.   The patient is insured through [facility] .   Per test claim: PA submitted to [employer] via CoverMyMeds Key/confirmation #/EOC [id number] Status is pending

## 2022-10-29 NOTE — Telephone Encounter (Signed)
Pharmacy Patient Advocate Encounter  Received notification from [facility] that Prior Authorization for Nurtec 75MG  dispersible tablets has been APPROVED from 10/28/2022 to 04/30/2023.Marland Kitchen  PA #/Case ID/Reference #: PA Case ID #: [id number]

## 2022-11-27 ENCOUNTER — Ambulatory Visit: Payer: 59 | Admitting: Family Medicine

## 2023-05-31 ENCOUNTER — Other Ambulatory Visit (HOSPITAL_COMMUNITY): Payer: Self-pay

## 2023-05-31 ENCOUNTER — Telehealth: Payer: Self-pay

## 2023-05-31 NOTE — Telephone Encounter (Signed)
 Pharmacy Patient Advocate Encounter   Received notification from CoverMyMeds that prior authorization for Nurtec 75MG  dispersible tablets is required/requested.   Insurance verification completed.   The patient is insured through [facility] .   Per test claim: PA required; PA submitted to above mentioned insurance via CoverMyMeds Key/confirmation #/EOC [id number] Status is pending

## 2023-06-05 NOTE — Telephone Encounter (Signed)
 Pharmacy Patient Advocate Encounter  Received notification from [facility] that Prior Authorization for Nurtec 75MG  dispersible tablets  has been APPROVED from 06/03/2023 to 11/30/2023   PA #/Case ID/Reference #: [id number]

## 2023-11-18 ENCOUNTER — Telehealth: Payer: Self-pay | Admitting: Pharmacist

## 2023-11-18 NOTE — Telephone Encounter (Signed)
 Please disregard, pt will need appt first since they have not been seen since 2023

## 2023-11-18 NOTE — Telephone Encounter (Signed)
 Pharmacy Patient Advocate Encounter   Received notification from CoverMyMeds that prior authorization for Nurtec 75 MG dispersible tablets is due for renewal.   Insurance verification completed.   The patient is insured through [facility]  Action: Patient hasn't been seen in your office in over a year. Plan requires updated chart notes for PA renewal.

## 2024-04-21 ENCOUNTER — Emergency Department (HOSPITAL_BASED_OUTPATIENT_CLINIC_OR_DEPARTMENT_OTHER)

## 2024-04-21 ENCOUNTER — Other Ambulatory Visit: Payer: Self-pay

## 2024-04-21 ENCOUNTER — Emergency Department (HOSPITAL_BASED_OUTPATIENT_CLINIC_OR_DEPARTMENT_OTHER)
Admission: EM | Admit: 2024-04-21 | Discharge: 2024-04-21 | Disposition: A | Discharge status: Home / Self Care | Attending: Emergency Medicine | Admitting: Emergency Medicine

## 2024-04-21 DIAGNOSIS — O99712 Diseases of the skin and subcutaneous tissue complicating pregnancy, second trimester: Secondary | ICD-10-CM | POA: Diagnosis not present

## 2024-04-21 DIAGNOSIS — O99891 Other specified diseases and conditions complicating pregnancy: Secondary | ICD-10-CM

## 2024-04-21 DIAGNOSIS — L0591 Pilonidal cyst without abscess: Secondary | ICD-10-CM | POA: Insufficient documentation

## 2024-04-21 DIAGNOSIS — R252 Cramp and spasm: Secondary | ICD-10-CM | POA: Insufficient documentation

## 2024-04-21 DIAGNOSIS — O99892 Other specified diseases and conditions complicating childbirth: Secondary | ICD-10-CM | POA: Diagnosis present

## 2024-04-21 LAB — CBC WITH DIFFERENTIAL/PLATELET
Abs Immature Granulocytes: 0.15 K/uL — ABNORMAL HIGH (ref 0.00–0.07)
Basophils Absolute: 0.1 K/uL (ref 0.0–0.1)
Basophils Relative: 0 %
Eosinophils Absolute: 0.1 K/uL (ref 0.0–0.5)
Eosinophils Relative: 1 %
HCT: 32.6 % — ABNORMAL LOW (ref 36.0–46.0)
Hemoglobin: 11.4 g/dL — ABNORMAL LOW (ref 12.0–15.0)
Immature Granulocytes: 1 %
Lymphocytes Relative: 16 %
Lymphs Abs: 2.5 K/uL (ref 0.7–4.0)
MCH: 30.2 pg (ref 26.0–34.0)
MCHC: 35 g/dL (ref 30.0–36.0)
MCV: 86.5 fL (ref 80.0–100.0)
Monocytes Absolute: 0.9 K/uL (ref 0.1–1.0)
Monocytes Relative: 6 %
Neutro Abs: 12 K/uL — ABNORMAL HIGH (ref 1.7–7.7)
Neutrophils Relative %: 76 %
Platelets: 363 K/uL (ref 150–400)
RBC: 3.77 MIL/uL — ABNORMAL LOW (ref 3.87–5.11)
RDW: 12.2 % (ref 11.5–15.5)
WBC: 15.7 K/uL — ABNORMAL HIGH (ref 4.0–10.5)
nRBC: 0 % (ref 0.0–0.2)

## 2024-04-21 LAB — COMPREHENSIVE METABOLIC PANEL WITH GFR
ALT: 23 U/L (ref 0–44)
AST: 21 U/L (ref 15–41)
Albumin: 4 g/dL (ref 3.5–5.0)
Alkaline Phosphatase: 49 U/L (ref 38–126)
Anion gap: 13 (ref 5–15)
BUN: 10 mg/dL (ref 6–20)
CO2: 23 mmol/L (ref 22–32)
Calcium: 9.6 mg/dL (ref 8.9–10.3)
Chloride: 100 mmol/L (ref 98–111)
Creatinine, Ser: 0.6 mg/dL (ref 0.44–1.00)
GFR, Estimated: >60 mL/min
Glucose, Bld: 92 mg/dL (ref 70–99)
Potassium: 3.6 mmol/L (ref 3.5–5.1)
Sodium: 135 mmol/L (ref 135–145)
Total Bilirubin: 0.3 mg/dL (ref 0.0–1.2)
Total Protein: 7.2 g/dL (ref 6.5–8.1)

## 2024-04-21 LAB — PROTIME-INR
INR: 1 (ref 0.8–1.2)
Prothrombin Time: 13.3 s (ref 11.4–15.2)

## 2024-04-21 LAB — MAGNESIUM: Magnesium: 2.1 mg/dL (ref 1.7–2.4)

## 2024-04-21 MED ORDER — LIDOCAINE-EPINEPHRINE-TETRACAINE (LET) TOPICAL GEL
3.0000 mL | Freq: Once | TOPICAL | Status: AC
  Administered 2024-04-21: 3 mL via TOPICAL
  Filled 2024-04-21: qty 3

## 2024-04-21 MED ORDER — LACTATED RINGERS IV BOLUS: 1000.0000 mL | Freq: Once | INTRAVENOUS | Status: DC

## 2024-04-21 NOTE — ED Notes (Signed)
 ED Provider at bedside.

## 2024-04-21 NOTE — ED Notes (Signed)
 Patient transported to Ultrasound

## 2024-04-21 NOTE — ED Triage Notes (Signed)
 C/o bilateral leg pain x 2 days. [redacted] weeks pregnant. Also hx of clotting disorder.  Also has cyst she wants drain.

## 2024-04-21 NOTE — Discharge Instructions (Addendum)
 You were seen in the ER today for evaluation of your symptoms. I would like for you to follow up with your PCP/OB for re-evaluation. Make sure that you are staying hydrated drinking fluids. For your pilonidal, please make sure the area is staying clean and dry. Clean at least 1-2 times daily with Dial soap and water. Rinse after using the bathroom. If you start to have any redness, bleeding, abdominal pain, trouble using the bathroom, fevers, return to the ER. If you have have any other concerns, new or worsening symptoms, please return to the ER for re-evaluation.   **You can also talk to your OB about your anxiety and management. This is not uncommon and there are things that can help!   Contact a health care provider if: Your leg cramps get worse or happen more often. Your leg cramps don't get better over time. Your foot becomes cold, numb, or blue.

## 2024-04-21 NOTE — ED Provider Notes (Signed)
 " [place] EMERGENCY DEPARTMENT AT [facility] Provider Note   CSN: [id number] Arrival date & time: 04/21/24  1407     Patient presents with: Leg Pain   Natasha Giles is a 29 y.o. female G4P0 presents to the ER today for evaluation of multiple complaints.  She reports that yesterday morning show developing some lower leg pain/cramping sensation.  Initially was in the right leg but shortly afterwards in the left.  Describes it as aching and cramping.  Denies any leg swelling.  Denies any chest pain or shortness of breath.  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  She is on a daily aspirin.  Additionally, she is reporting that on Friday she started noticing that she was getting another pilonidal cyst.  Has no trouble defecating or urinating.  No changes noted to this either.  She has not had any fevers or any dysuria or hematuria.  Denies any abdominal pain or cramping.  Denies any vaginal bleeding or any fevers.  She reports that she last had to have this drained.  Reports that she is around [redacted] weeks pregnant.  Patient reports that she is generally anxious.   Leg Pain Associated symptoms: no fever        Prior to Admission medications  Medication Sig Start Date End Date Taking? Authorizing Provider  albuterol  (VENTOLIN  HFA) 108 (90 Base) MCG/ACT inhaler Inhale 2 puffs into the lungs every 6 (six) hours as needed. 03/09/22     amoxicillin -clavulanate (AUGMENTIN ) 875-125 MG tablet Take 1 tablet by mouth every 12 (twelve) hours. 05/07/22   Maranda Jamee Jacob, MD  fluconazole  (DIFLUCAN ) 150 MG tablet Take 1 tablet (150 mg total) by mouth once for 1 dose. May repeat in 72 hours 05/07/22   Maranda Jamee Jacob, MD  norethindrone  (CAMILA ) 0.35 MG tablet Take 1 tablet (0.35 mg total) by mouth daily. 08/30/21     phentermine  (ADIPEX-P ) 37.5 MG tablet Take 1 tablet (37.5  mg total) by mouth daily. 05/01/22     Rimegepant Sulfate  75 MG TBDP DISSOLVE 1 TABLET BY MOUTH ONCE DAILY AS NEEDED FOR ABORTIVE THERAPY OF MIGRAINE. NO MORE THAN 1 TABLET IN 24 HOURS OR 10 TABLETS PER MONTH. 11/29/21   Lomax, Amy, NP  amitriptyline  (ELAVIL ) 25 MG tablet Take 1 tablet (25 mg total) by mouth at bedtime. 02/13/21 11/23/21      Allergies: Penicillins    Review of Systems  Constitutional:  Negative for chills and fever.  Respiratory:  Negative for shortness of breath.   Cardiovascular:  Negative for chest pain.    Updated Vital Signs BP (!) 144/88 (BP Location: Right Arm)   Pulse (!) 109   Temp 98.4 F (36.9 C) (Oral)   Resp 14   SpO2 100%   Physical Exam Vitals and nursing note reviewed.  Constitutional:      Appearance: She is not toxic-appearing.     Comments: Anxious and tearful  HENT:     Mouth/Throat:     Mouth: Mucous membranes are moist.  Eyes:     General: No scleral icterus. Cardiovascular:     Pulses:          Dorsalis pedis pulses are 2+ on the right side and 2+ on the left side.       Posterior tibial pulses are 2+ on the right side and 2+ on the left side.  Pulmonary:     Effort: Pulmonary effort is normal. No respiratory distress.  Genitourinary:    Comments: Fetal heart tone at 154 with bedside doppler. Musculoskeletal:     Right lower leg: No edema.     Left lower leg: No edema.  Skin:    General: Skin is warm and dry.  Neurological:     Mental Status: She is alert.     (all labs ordered are listed, but only abnormal results are displayed) Labs Reviewed  CBC WITH DIFFERENTIAL/PLATELET - Abnormal; Notable for the following components:      Result Value   WBC 15.7 (*)    RBC 3.77 (*)    Hemoglobin 11.4 (*)    HCT 32.6 (*)    Neutro Abs 12.0 (*)    Abs Immature Granulocytes 0.15 (*)    All other components within normal limits  COMPREHENSIVE METABOLIC PANEL WITH GFR  PROTIME-INR  MAGNESIUM    EKG: None  Radiology: No results  found.  {Document cardiac monitor, telemetry assessment procedure when appropriate:32947} .Incision and Drainage  Date/Time: 04/21/2024 7:20 PM  Performed by: Bernis Ernst, PA-C Authorized by: Bernis Ernst, PA-C   Consent:    Consent obtained:  Verbal    Medications Ordered in the ED - No data to display    {Click here for ABCD2, HEART and other calculators REFRESH Note before signing:1}                              Medical Decision Making Amount and/or Complexity of Data Reviewed Labs: ordered.   29 y.o. female presents to the ER for evaluation of ***. Differential diagnosis includes but is not limited to ***. Vital signs ***. Physical exam as noted above.   I independently reviewed and interpreted the patient's labs. ***.    We discussed the results of the labs/imaging. The plan is supportive care, hydration, wound care. We discussed strict return precautions and red flag symptoms. The patient verbalized their understanding and agrees to the plan. The patient is stable and being discharged home in good condition.  Portions of this report may have been transcribed using voice recognition software. Every effort was made to ensure accuracy; however, inadvertent computerized transcription errors may be present.    Final diagnoses:  None    ED Discharge Orders     None        "

## 2024-04-28 MED ORDER — LIDOCAINE-EPINEPHRINE-TETRACAINE (LET) TOPICAL GEL
3.0000 mL | TOPICAL | Status: AC
  Administered 2024-04-21: 3 mL
